# Patient Record
Sex: Female | Born: 1960 | Race: White | Hispanic: No | Marital: Married | Smoking: Never smoker
Health system: Southern US, Community
[De-identification: ages and names within clinical notes are randomized; demographics above are authoritative.]

## PROBLEM LIST (undated history)

## (undated) DIAGNOSIS — Z87442 Personal history of urinary calculi: Secondary | ICD-10-CM

## (undated) DIAGNOSIS — C50919 Malignant neoplasm of unspecified site of unspecified female breast: Secondary | ICD-10-CM

## (undated) DIAGNOSIS — Z9889 Other specified postprocedural states: Secondary | ICD-10-CM

## (undated) DIAGNOSIS — C801 Malignant (primary) neoplasm, unspecified: Secondary | ICD-10-CM

## (undated) DIAGNOSIS — T4145XA Adverse effect of unspecified anesthetic, initial encounter: Secondary | ICD-10-CM

## (undated) DIAGNOSIS — K219 Gastro-esophageal reflux disease without esophagitis: Secondary | ICD-10-CM

## (undated) DIAGNOSIS — C439 Malignant melanoma of skin, unspecified: Secondary | ICD-10-CM

## (undated) DIAGNOSIS — R112 Nausea with vomiting, unspecified: Secondary | ICD-10-CM

## (undated) DIAGNOSIS — Z923 Personal history of irradiation: Secondary | ICD-10-CM

## (undated) DIAGNOSIS — T8859XA Other complications of anesthesia, initial encounter: Secondary | ICD-10-CM

## (undated) HISTORY — PX: FRACTURE SURGERY: SHX138

## (undated) HISTORY — PX: HAND SURGERY: SHX662

## (undated) HISTORY — PX: MELANOMA EXCISION: SHX5266

## (undated) HISTORY — PX: LITHOTRIPSY: SUR834

## (undated) HISTORY — PX: OTHER SURGICAL HISTORY: SHX169

## (undated) HISTORY — DX: Malignant melanoma of skin, unspecified: C43.9

---
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
## 1989-11-08 DIAGNOSIS — Z87442 Personal history of urinary calculi: Secondary | ICD-10-CM

## 1989-11-08 DIAGNOSIS — N200 Calculus of kidney: Secondary | ICD-10-CM | POA: Insufficient documentation

## 1989-11-08 HISTORY — DX: Personal history of urinary calculi: Z87.442

## 1991-11-09 DIAGNOSIS — C439 Malignant melanoma of skin, unspecified: Secondary | ICD-10-CM

## 1991-11-09 HISTORY — DX: Malignant melanoma of skin, unspecified: C43.9

## 2006-07-08 ENCOUNTER — Ambulatory Visit: Payer: Self-pay | Admitting: Obstetrics and Gynecology

## 2007-07-11 DIAGNOSIS — D239 Other benign neoplasm of skin, unspecified: Secondary | ICD-10-CM

## 2007-07-11 HISTORY — DX: Other benign neoplasm of skin, unspecified: D23.9

## 2007-07-13 ENCOUNTER — Ambulatory Visit: Payer: Self-pay | Admitting: Dermatology

## 2008-05-22 DIAGNOSIS — C4491 Basal cell carcinoma of skin, unspecified: Secondary | ICD-10-CM

## 2008-05-22 HISTORY — DX: Basal cell carcinoma of skin, unspecified: C44.91

## 2012-11-08 HISTORY — PX: OTHER SURGICAL HISTORY: SHX169

## 2013-05-04 ENCOUNTER — Ambulatory Visit: Payer: Self-pay | Admitting: Gastroenterology

## 2013-05-07 LAB — PATHOLOGY REPORT

## 2014-03-18 DIAGNOSIS — N390 Urinary tract infection, site not specified: Secondary | ICD-10-CM | POA: Insufficient documentation

## 2016-07-02 ENCOUNTER — Ambulatory Visit: Payer: Self-pay

## 2016-07-02 ENCOUNTER — Ambulatory Visit
Admission: RE | Admit: 2016-07-02 | Discharge: 2016-07-02 | Disposition: A | Discharge status: Home / Self Care | Payer: PRIVATE HEALTH INSURANCE | Source: Ambulatory Visit | Attending: Obstetrics and Gynecology | Admitting: Obstetrics and Gynecology

## 2016-07-02 ENCOUNTER — Other Ambulatory Visit: Payer: Self-pay | Admitting: Obstetrics and Gynecology

## 2016-07-02 DIAGNOSIS — Z1231 Encounter for screening mammogram for malignant neoplasm of breast: Secondary | ICD-10-CM | POA: Diagnosis not present

## 2016-11-08 DIAGNOSIS — Z923 Personal history of irradiation: Secondary | ICD-10-CM

## 2016-11-08 HISTORY — DX: Personal history of irradiation: Z92.3

## 2017-07-12 DIAGNOSIS — C439 Malignant melanoma of skin, unspecified: Secondary | ICD-10-CM | POA: Insufficient documentation

## 2017-08-04 ENCOUNTER — Other Ambulatory Visit: Payer: Self-pay | Admitting: Obstetrics and Gynecology

## 2017-08-04 ENCOUNTER — Ambulatory Visit
Admission: RE | Admit: 2017-08-04 | Discharge: 2017-08-04 | Disposition: A | Discharge status: Home / Self Care | Payer: PRIVATE HEALTH INSURANCE | Source: Ambulatory Visit | Attending: Obstetrics and Gynecology | Admitting: Obstetrics and Gynecology

## 2017-08-04 DIAGNOSIS — R921 Mammographic calcification found on diagnostic imaging of breast: Secondary | ICD-10-CM | POA: Insufficient documentation

## 2017-08-04 DIAGNOSIS — Z1231 Encounter for screening mammogram for malignant neoplasm of breast: Secondary | ICD-10-CM | POA: Diagnosis not present

## 2017-08-04 HISTORY — DX: Malignant (primary) neoplasm, unspecified: C80.1

## 2017-08-08 ENCOUNTER — Other Ambulatory Visit: Payer: Self-pay | Admitting: Obstetrics and Gynecology

## 2017-08-08 DIAGNOSIS — R921 Mammographic calcification found on diagnostic imaging of breast: Secondary | ICD-10-CM

## 2017-08-08 DIAGNOSIS — C50919 Malignant neoplasm of unspecified site of unspecified female breast: Secondary | ICD-10-CM

## 2017-08-08 DIAGNOSIS — R928 Other abnormal and inconclusive findings on diagnostic imaging of breast: Secondary | ICD-10-CM

## 2017-08-08 HISTORY — DX: Malignant neoplasm of unspecified site of unspecified female breast: C50.919

## 2017-08-19 ENCOUNTER — Ambulatory Visit
Admission: RE | Admit: 2017-08-19 | Discharge: 2017-08-19 | Disposition: A | Discharge status: Home / Self Care | Payer: PRIVATE HEALTH INSURANCE | Source: Ambulatory Visit | Attending: Obstetrics and Gynecology | Admitting: Obstetrics and Gynecology

## 2017-08-19 DIAGNOSIS — R928 Other abnormal and inconclusive findings on diagnostic imaging of breast: Secondary | ICD-10-CM

## 2017-08-19 DIAGNOSIS — R921 Mammographic calcification found on diagnostic imaging of breast: Secondary | ICD-10-CM | POA: Insufficient documentation

## 2017-08-23 ENCOUNTER — Other Ambulatory Visit: Payer: Self-pay | Admitting: Obstetrics and Gynecology

## 2017-08-23 DIAGNOSIS — R921 Mammographic calcification found on diagnostic imaging of breast: Secondary | ICD-10-CM

## 2017-08-23 DIAGNOSIS — R928 Other abnormal and inconclusive findings on diagnostic imaging of breast: Secondary | ICD-10-CM

## 2017-09-01 ENCOUNTER — Ambulatory Visit
Admission: RE | Admit: 2017-09-01 | Discharge: 2017-09-01 | Disposition: A | Discharge status: Home / Self Care | Payer: PRIVATE HEALTH INSURANCE | Source: Ambulatory Visit | Attending: Obstetrics and Gynecology | Admitting: Obstetrics and Gynecology

## 2017-09-01 DIAGNOSIS — R921 Mammographic calcification found on diagnostic imaging of breast: Secondary | ICD-10-CM

## 2017-09-01 DIAGNOSIS — R928 Other abnormal and inconclusive findings on diagnostic imaging of breast: Secondary | ICD-10-CM

## 2017-09-01 HISTORY — PX: BREAST BIOPSY: SHX20

## 2017-09-06 ENCOUNTER — Other Ambulatory Visit: Payer: Self-pay | Admitting: Pathology

## 2017-09-06 LAB — SURGICAL PATHOLOGY

## 2017-09-08 NOTE — Progress Notes (Signed)
  Oncology Nurse Navigator Documentation  Navigator Location: [facility]Med Onc (09/08/17 1700) Referral date to RadOnc/MedOnc: 09/08/17 (09/08/17 1700) )Navigator Encounter Type: Introductory phone call (09/08/17 1700)   Abnormal Finding Date: 08/19/17 (09/08/17 1700) Confirmed Diagnosis Date: 09/01/17 (09/08/17 1700)               Patient Visit Type: Initial (09/08/17 1700)   Barriers/Navigation Needs: Coordination of Care;Education (09/08/17 1700) Education: Accessing Care/ Finding Providers;Understanding Cancer/ Treatment Options;Coping with Diagnosis/ Prognosis;Newly Diagnosed Cancer Education (09/08/17 1700) Interventions: Coordination of Care;Referrals (09/08/17 1700)   Coordination of Care: Appts (09/08/17 1700)                  Time Spent with Patient: 45 (09/08/17 1700)   Introduced IT trainer.  Schedule Surgical Consult with Dr. Tamala Julian 11/2//18 at 0945.

## 2017-09-12 ENCOUNTER — Other Ambulatory Visit: Payer: Self-pay | Admitting: Surgery

## 2017-09-12 ENCOUNTER — Telehealth: Payer: Self-pay | Admitting: Oncology

## 2017-09-12 ENCOUNTER — Telehealth: Payer: Self-pay | Admitting: *Deleted

## 2017-09-12 DIAGNOSIS — D0511 Intraductal carcinoma in situ of right breast: Secondary | ICD-10-CM

## 2017-09-12 NOTE — Telephone Encounter (Signed)
Consult for Right Breast DCIS. Ref by Dr. Benjaman Kindler. Notes in Epic. Appt req by [facility] msg. New patient packet left at Registration for patient to complete upon arrival. L/M on patient v/m. Anne/Christy notified via staff msg.

## 2017-09-12 NOTE — Telephone Encounter (Signed)
Received a call from [facility] at Dr. Thompson Caul office that the patient was not aware of a medical oncology appointment.  Called and left patient a message that she has an appointment with Dr. Tasia Catchings tomorrow at 9:30.

## 2017-09-13 ENCOUNTER — Inpatient Hospital Stay: Payer: PRIVATE HEALTH INSURANCE | Attending: Oncology | Admitting: Oncology

## 2017-09-13 ENCOUNTER — Encounter: Payer: Self-pay | Admitting: Oncology

## 2017-09-13 VITALS — BP 119/75 | HR 79 | Temp 98.0°F | Resp 18 | Wt 173.4 lb

## 2017-09-13 DIAGNOSIS — Z79899 Other long term (current) drug therapy: Secondary | ICD-10-CM | POA: Diagnosis not present

## 2017-09-13 DIAGNOSIS — Z170 Estrogen receptor positive status [ER+]: Secondary | ICD-10-CM | POA: Insufficient documentation

## 2017-09-13 DIAGNOSIS — G8929 Other chronic pain: Secondary | ICD-10-CM | POA: Diagnosis not present

## 2017-09-13 DIAGNOSIS — Z85820 Personal history of malignant melanoma of skin: Secondary | ICD-10-CM | POA: Insufficient documentation

## 2017-09-13 DIAGNOSIS — D0511 Intraductal carcinoma in situ of right breast: Secondary | ICD-10-CM | POA: Diagnosis not present

## 2017-09-13 DIAGNOSIS — M549 Dorsalgia, unspecified: Secondary | ICD-10-CM | POA: Insufficient documentation

## 2017-09-13 NOTE — Progress Notes (Signed)
New patient in to see Dr Tasia Catchings today, for right breast cancer.

## 2017-09-13 NOTE — Progress Notes (Signed)
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 Patient had recent bilateral mammogram done on 08/04/2017 which demonstrated calcifications in the right breast. She had additional film on 08/19/2017 which demonstrated calcification in the posterior aspect of the upper outer quadrant of the right breast. Calcifications spanning an area of 8 mm and are associated with an asymmetric density. Patient had a steroid tactic biopsy on 09/01/2017. Pathology reveals high-grade DCIS, with comedonecrosis and calcifications. ER positive > 90%, PR positive>1%.  Patient denies palpating any breast mass or having any nipple discharge. She denies any breast tenderness or skin changes. She has a remote history of 2 episodes of early stage melanoma. First episode was back in 1993 and she received resection. Since then she has dermatology surveillance routinely and found out another early-stage melanoma which was resected again. She does any family history of cancer. She has been on OCP for many years until 3 years ago, she was taken off OCP and her menstrual period stopped 3 years ago. She denies any use of estrogen replacement. 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 Reports chronic back pain. Denies any cough, chest pain, abdominal pain.   Review of systems- Review of Systems  Constitutional: Negative for chills and fever.  HENT: Negative for ear pain, hearing loss and tinnitus.   Eyes: Negative for double vision and pain.  Respiratory: Negative for  cough and hemoptysis.   Cardiovascular: Negative for chest pain and palpitations.  Gastrointestinal: Negative for heartburn and nausea.  Genitourinary: Negative for dysuria and urgency.  Musculoskeletal: Positive for back pain.  Skin: Negative for rash.  Neurological: Negative for dizziness.  Endo/Heme/Allergies: Does not bruise/bleed easily.  Psychiatric/Behavioral: Negative for depression.   Allergies  Allergen Reactions  . Sulfa Antibiotics Hives and Rash     Past Medical History:  Diagnosis Date  . Cancer ([place])    melanoma    Past Surgical History:  Procedure Laterality Date  . BREAST BIOPSY Right 09/01/2017   AFFIRM Stereo/path pending  . coloscopy  2014  . leg left rod and pins      Social History   Socioeconomic History  . Marital status: Married    Spouse name: Not on file  . Number of children: Not on file  . Years of education: Not on file  . Highest education level: Not on file  Social Needs  . Financial resource strain: Not on file  . Food insecurity - worry: Not on file  . Food insecurity - inability: Not on file  . Transportation needs - medical: Not on file  . Transportation needs - non-medical: Not on file  Occupational History  . Not on file  Tobacco Use  . Smoking status: Never Smoker  . Smokeless tobacco: Never Used  . Tobacco comment: no tobacco use  Substance and Sexual Activity  . Alcohol use: No    Frequency: Never  . Drug use: No  . Sexual activity: Yes  Other Topics Concern  . Not on file  Social History Narrative  . Not on file     Family  History  Problem Relation Age of Onset  . Hypertension Mother   . Heart disease Mother   . Diabetes Father   . Hypertension Father   . Heart disease Father   . Breast cancer Neg Hx      Current Outpatient Medications:  .  cholecalciferol (VITAMIN D) 1000 units tablet, Take 5,000 Units 2 (two) times daily by mouth., Disp: , Rfl:  .  omega-3 acid ethyl esters (LOVAZA) 1 g capsule, Take  1 g 2 (two) times daily by mouth., Disp: , Rfl:    Physical exam:  Vitals:   09/13/17 1022  BP: 119/75  Pulse: 79  Resp: 18  Temp: 98 F (36.7 C)  TempSrc: Tympanic  Weight: 173 lb 7 oz (78.7 kg)   GENERAL:Alert, no distress and comfortable.  EYES: no pallor or icterus OROPHARYNX: no thrush or ulceration; good dentition  NECK: supple, no masses felt LYMPH:  no palpable lymphadenopathy in the cervical, axillary or inguinal regions LUNGS: clear to auscultation and  No wheeze or crackles HEART/CVS: regular rate & rhythm and no murmurs; No lower extremity edema ABDOMEN: abdomen soft, non-tender and normal bowel sounds Musculoskeletal:no cyanosis of digits and no clubbing  PSYCH: alert & oriented x 3  NEURO: no focal motor/sensory deficits SKIN:  no rashes or significant lesions Breasts: breasts appear normal, no suspicious masses, no skin or nipple changes or axillary nodes  Mm Digital Diagnostic Unilat R  Result Date: 08/19/2017 CLINICAL DATA:  Patient was called back from screening mammogram for calcifications in the right breast. EXAM: DIGITAL DIAGNOSTIC RIGHT MAMMOGRAM WITH CAD ULTRASOUND RIGHT BREAST COMPARISON:  With priors. ACR Breast Density Category b: There are scattered areas of fibroglandular density. FINDINGS: Additional imaging of the right breast was performed. There are developing calcifications in the far posterior aspect of the upper-outer quadrant of the right breast. Calcifications located more inferior and posteriorly span an area of 8 mm and are associated with an asymmetric density. They are heterogeneous in appearance and indeterminate. There are 2 calcifications located more anteriorly. They are smooth, round and benign in appearance. Mammographic images were processed with CAD. On physical exam, I do not palpate a mass in the upper-outer quadrant of the right breast. Targeted ultrasound is performed, showing normal tissue in the upper-outer quadrant of the right  breast. No solid or cystic mass, abnormal shadowing or distortion visualized. Sonographic evaluation the right axilla does not show any enlarged adenopathy. IMPRESSION: Suspicious right breast calcifications. RECOMMENDATION: Stereotactic biopsy of the right breast calcifications is recommended. The calcifications are located far posteriorly and may not be amenable to stereotactic biopsy. If we are unable to stereotactically biopsy the calcifications I recommend surgical excision. I have discussed the findings and recommendations with the patient. Results were also provided in writing at the conclusion of the visit. If applicable, a reminder letter will be sent to the patient regarding the next appointment. BI-RADS CATEGORY  4: Suspicious. Electronically Signed   By: Lillia Mountain M.D.   On: 08/19/2017 13:48   US Breast Ltd Uni Right Inc Axilla  Result Date: 08/19/2017 CLINICAL DATA:  Patient was called back from screening mammogram for calcifications in the right breast. EXAM: DIGITAL DIAGNOSTIC RIGHT MAMMOGRAM WITH CAD ULTRASOUND RIGHT BREAST COMPARISON:  With priors. ACR Breast Density Category b: There are scattered areas of fibroglandular density. FINDINGS: Additional imaging of the right breast was performed. There are developing calcifications in the far posterior aspect of the upper-outer quadrant of the right breast. Calcifications located  more inferior and posteriorly span an area of 8 mm and are associated with an asymmetric density. They are heterogeneous in appearance and indeterminate. There are 2 calcifications located more anteriorly. They are smooth, round and benign in appearance. Mammographic images were processed with CAD. On physical exam, I do not palpate a mass in the upper-outer quadrant of the right breast. Targeted ultrasound is performed, showing normal tissue in the upper-outer quadrant of the right breast. No solid or cystic mass, abnormal shadowing or distortion visualized.  Sonographic evaluation the right axilla does not show any enlarged adenopathy. IMPRESSION: Suspicious right breast calcifications. RECOMMENDATION: Stereotactic biopsy of the right breast calcifications is recommended. The calcifications are located far posteriorly and may not be amenable to stereotactic biopsy. If we are unable to stereotactically biopsy the calcifications I recommend surgical excision. I have discussed the findings and recommendations with the patient. Results were also provided in writing at the conclusion of the visit. If applicable, a reminder letter will be sent to the patient regarding the next appointment. BI-RADS CATEGORY  4: Suspicious. Electronically Signed   By: Lillia Mountain M.D.   On: 08/19/2017 13:48   Mm Clip Placement Right  Result Date: 09/01/2017 CLINICAL DATA:  Status post stereotactic biopsy for right breast calcifications. EXAM: DIAGNOSTIC RIGHT MAMMOGRAM POST STEREOTACTIC BIOPSY COMPARISON:  Previous exam(s). FINDINGS: Mammographic images were obtained following stereotactic guided biopsy of right breast calcifications. At the conclusion of the procedure, a ribbon shaped clip was placed at the biopsy site. Biopsy clip is displaced approximately 2 cm lateral to the biopsy cavity. IMPRESSION: Ribbon shaped biopsy clip is displaced approximately 2 cm lateral to the biopsy cavity. A few residual calcifications are seen adjacent to the biopsy cavity which can be used for localization purposes if pathology result necessitates surgical excision. Final Assessment: Post Procedure Mammograms for Marker Placement Electronically Signed   By: Franki Cabot M.D.   On: 09/01/2017 13:58   Mm Rt Breast Bx W Loc Dev 1st Lesion Image Bx Spec Stereo Guide  Addendum Date: 09/09/2017   ADDENDUM REPORT: 09/08/2017 17:12 ADDENDUM: At the patient's request, a surgical referral was made with Dr. Tamala Julian for 09/09/17 at 9:45 AM by Al Pimple, RN, nurse navigator for [facility].  The patient has been notified of the appointment. Addendum by Jetta Lout, RRA on 09/08/17. Electronically Signed   By: Claudie Revering M.D.   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 Microinvasive carcinoma could not be excluded. This is concordant with the imaging findings. This will be communicated to the nurse navigators at [facility] and they will arrange a consultation appointment for the patient. The final pathological diagnosis and recommendation were discussed with the patient by telephone on 09/06/2017. Her questions were answered. She reported mild bruising at the biopsy site with no pain or palpable hematoma. Electronically Signed   By: Claudie Revering M.D.   On: 09/07/2017 09:21   Result Date: 09/09/2017 CLINICAL DATA:  Patient with right breast calcifications presents today for stereotactic biopsy using 3D tomosynthesis guidance. EXAM: RIGHT BREAST STEREOTACTIC CORE NEEDLE BIOPSY COMPARISON:  Previous exams. FINDINGS: The patient and I discussed the procedure of stereotactic-guided biopsy including benefits and alternatives. We discussed the high likelihood of a successful procedure. We discussed the risks of the procedure including infection, bleeding, tissue injury, clip migration, and inadequate sampling. Informed written  consent was given. The usual time out protocol was performed immediately prior to the procedure. Using sterile technique and 1% Lidocaine as local anesthetic, under stereotactic guidance, a 9 gauge vacuum assisted device was used to perform core needle biopsy of calcifications in the upper-outer quadrant of the right breast using a lateral approach. Specimen radiograph was performed showing calcifications. Specimens with calcifications are identified for pathology. Lesion quadrant: Upper outer quadrant  At the conclusion of the procedure, a ribbon shaped tissue marker clip was deployed into the biopsy cavity. Follow-up 2-view mammogram was performed and dictated separately. IMPRESSION: Stereotactic-guided biopsy of calcifications in the upper-outer quadrant of the right breast. No apparent complications. Electronically Signed: By: Franki Cabot M.D. On: 09/01/2017 13:38    Assessment and plan- Patient is a 56 y.o. female who has distant history of melanoma s/p resection presented for evaluation of recently diagnosed right breast high grade DCIS.  Pathology results was discussed with patient. Patient has already been evaluated by Dr. Tamala Julian. Agree with Dr. Tamala Julian that patient needs lumpectomy plus minus sentinel lymph node biopsy due to high grade DCIS. Final recommendation for adjuvant therapy pending on resection pathology.  Thank you for this kind referral and the opportunity to participate in the care of this patient Follow up approximately 1 week after lumpectomy.   Dr. Earlie Server, MD, PhD [facility] Pager- [phone] 09/13/2017

## 2017-09-14 NOTE — Progress Notes (Signed)
  Oncology Nurse Navigator Documentation  Navigator Location: [facility] Onc (09/13/17 1359)   )Navigator Encounter Type: Initial MedOnc (09/13/17 1359)       Surgery Date: 09/27/17 (09/13/17 1359)             Patient Visit Type: MedOnc;Initial (09/13/17 1359) Treatment Phase: Pre-Tx/Tx Discussion (09/13/17 1359) Barriers/Navigation Needs: Education;Coordination of Care (09/13/17 1359) Education: Understanding Cancer/ Treatment Options;Coping with Diagnosis/ Prognosis;Preparing for Upcoming Surgery/ Treatment;Newly Diagnosed Cancer Education (09/13/17 1359) Interventions: Coordination of Care;Education (09/13/17 1359)   Coordination of Care: Appts (09/13/17 1359)                  Time Spent with Patient: 60 (09/13/17 1359)   Met with patient at initial Med/Onc consult.  Dr. Tasia Catchings thoroughly explained treatment plans with final plan dependent on Surgical Pathology.   Patient is scheduled for lumpectomy with Dr. Rochel Brome on 09/27/17.

## 2017-09-15 ENCOUNTER — Other Ambulatory Visit: Payer: Self-pay

## 2017-09-19 ENCOUNTER — Inpatient Hospital Stay: Payer: PRIVATE HEALTH INSURANCE

## 2017-09-21 ENCOUNTER — Other Ambulatory Visit: Payer: Self-pay

## 2017-09-21 ENCOUNTER — Encounter
Admission: RE | Admit: 2017-09-21 | Discharge: 2017-09-21 | Disposition: A | Discharge status: Home / Self Care | Payer: PRIVATE HEALTH INSURANCE | Source: Ambulatory Visit | Attending: Surgery | Admitting: Surgery

## 2017-09-21 HISTORY — DX: Personal history of urinary calculi: Z87.442

## 2017-09-21 HISTORY — DX: Other complications of anesthesia, initial encounter: T88.59XA

## 2017-09-21 HISTORY — DX: Nausea with vomiting, unspecified: R11.2

## 2017-09-21 HISTORY — DX: Adverse effect of unspecified anesthetic, initial encounter: T41.45XA

## 2017-09-21 HISTORY — DX: Other specified postprocedural states: Z98.890

## 2017-09-21 HISTORY — DX: Gastro-esophageal reflux disease without esophagitis: K21.9

## 2017-09-21 NOTE — Patient Instructions (Signed)
  Your procedure is scheduled on: 09-27-17 Report to [place] [facility] @ 7:45 AM  Remember: Instructions that are not followed completely may result in serious medical risk, up to and including death, or upon the discretion of your surgeon and anesthesiologist your surgery may need to be rescheduled.    _x___ 1. Do not eat food after midnight the night before your procedure. You may drink clear liquids up to 2 hours before you are scheduled to arrive at the hospital for your procedure.  Do not drink clear liquids within 2 hours of your scheduled arrival to the hospital.  Clear liquids include  --Water or Apple juice without pulp  --Clear carbohydrate beverage such as ClearFast or Gatorade  --Black Coffee or Clear Tea  (No milk, no creamers, do not add anything to  the coffee or Tea   No gum chewing or hard candies.     __x__ 2. No Alcohol for 24 hours before or after surgery.   __x__3. No Smoking for 24 prior to surgery.   ____  4. Bring all medications with you on the day of surgery if instructed.    __x__ 5. Notify your doctor if there is any change in your medical condition     (cold, fever, infections).     Do not wear jewelry, make-up, hairpins, clips or nail polish.  Do not wear lotions, powders, or perfumes. You may wear deodorant.  Do not shave 48 hours prior to surgery. Men may shave face and neck.  Do not bring valuables to the hospital.    [facility] is not responsible for any belongings or valuables.               Contacts, dentures or bridgework may not be worn into surgery.  Leave your suitcase in the car. After surgery it may be brought to your room.  For patients admitted to the hospital, discharge time is determined by your treatment team.   Patients discharged the day of surgery will not be allowed to drive home.  You will need someone to drive you home and stay with you the night of your procedure.      ____ Take anti-hypertensive listed below, cardiac,  seizure, asthma, anti-reflux and psychiatric medicines. These include:  1. NONE  2.  3.  4.  5.  6.  ____Fleets enema or Magnesium Citrate as directed.   ____ Use CHG Soap or sage wipes as directed on instruction sheet   ____ Use inhalers on the day of surgery and bring to hospital day of surgery  ____ Stop Metformin and Janumet 2 days prior to surgery.    ____ Take 1/2 of usual insulin dose the night before surgery and none on the morning  surgery.   ____ Follow recommendations from Cardiologist, Pulmonologist or PCP regarding stopping Aspirin, Coumadin, Plavix ,Eliquis, Effient, or Pradaxa, and Pletal.  X____Stop Anti-inflammatories such as Advil, Aleve, Ibuprofen, Motrin, Naproxen, Naprosyn, Goodies powders or aspirin products NOW-OK to take Tylenol    _X___ Stop supplements until after surgery-PT STOPPED FISH OIL ALREADY   ____ Bring C-Pap to the hospital.

## 2017-09-26 ENCOUNTER — Other Ambulatory Visit: Payer: Self-pay | Admitting: Surgery

## 2017-09-26 ENCOUNTER — Encounter: Payer: Self-pay | Admitting: *Deleted

## 2017-09-26 DIAGNOSIS — D0511 Intraductal carcinoma in situ of right breast: Secondary | ICD-10-CM

## 2017-09-27 ENCOUNTER — Encounter
Admission: RE | Disposition: A | Discharge status: Home / Self Care | Payer: Self-pay | Source: Ambulatory Visit | Attending: Surgery

## 2017-09-27 ENCOUNTER — Encounter: Payer: Self-pay | Admitting: Emergency Medicine

## 2017-09-27 ENCOUNTER — Ambulatory Visit
Admission: RE | Admit: 2017-09-27 | Discharge: 2017-09-27 | Disposition: A | Discharge status: Home / Self Care | Payer: PRIVATE HEALTH INSURANCE | Source: Ambulatory Visit | Attending: Surgery | Admitting: Surgery

## 2017-09-27 ENCOUNTER — Ambulatory Visit: Payer: PRIVATE HEALTH INSURANCE | Admitting: Certified Registered Nurse Anesthetist

## 2017-09-27 DIAGNOSIS — Z882 Allergy status to sulfonamides status: Secondary | ICD-10-CM | POA: Insufficient documentation

## 2017-09-27 DIAGNOSIS — K219 Gastro-esophageal reflux disease without esophagitis: Secondary | ICD-10-CM | POA: Diagnosis not present

## 2017-09-27 DIAGNOSIS — D0511 Intraductal carcinoma in situ of right breast: Secondary | ICD-10-CM

## 2017-09-27 DIAGNOSIS — Z79899 Other long term (current) drug therapy: Secondary | ICD-10-CM | POA: Insufficient documentation

## 2017-09-27 HISTORY — DX: Malignant neoplasm of unspecified site of unspecified female breast: C50.919

## 2017-09-27 HISTORY — PX: BREAST LUMPECTOMY: SHX2

## 2017-09-27 HISTORY — PX: PARTIAL MASTECTOMY WITH NEEDLE LOCALIZATION: SHX6008

## 2017-09-27 HISTORY — PX: SENTINEL NODE BIOPSY: SHX6608

## 2017-09-27 SURGERY — PARTIAL MASTECTOMY WITH NEEDLE LOCALIZATION
Anesthesia: General | Laterality: Right | Wound class: Clean

## 2017-09-27 MED ORDER — FAMOTIDINE 20 MG PO TABS
ORAL_TABLET | ORAL | Status: AC
  Filled 2017-09-27: qty 1

## 2017-09-27 MED ORDER — BUPIVACAINE-EPINEPHRINE 0.5% -1:200000 IJ SOLN
INTRAMUSCULAR | Status: DC | PRN
  Administered 2017-09-27: 15 mL

## 2017-09-27 MED ORDER — BUPIVACAINE-EPINEPHRINE (PF) 0.5% -1:200000 IJ SOLN
INTRAMUSCULAR | Status: AC
  Filled 2017-09-27: qty 30

## 2017-09-27 MED ORDER — HYDROCODONE-ACETAMINOPHEN 5-325 MG PO TABS
1.0000 | ORAL_TABLET | Freq: Four times a day (QID) | ORAL | Status: DC | PRN
  Administered 2017-09-27: 1 via ORAL

## 2017-09-27 MED ORDER — ONDANSETRON HCL 4 MG/2ML IJ SOLN: 4.0000 mg | Freq: Once | INTRAMUSCULAR | Status: DC | PRN

## 2017-09-27 MED ORDER — LACTATED RINGERS IV SOLN
INTRAVENOUS | Status: DC
  Administered 2017-09-27: 10:00:00 via INTRAVENOUS

## 2017-09-27 MED ORDER — ONDANSETRON HCL 4 MG/2ML IJ SOLN
INTRAMUSCULAR | Status: DC | PRN
  Administered 2017-09-27: 4 mg via INTRAVENOUS

## 2017-09-27 MED ORDER — MIDAZOLAM HCL 2 MG/2ML IJ SOLN
INTRAMUSCULAR | Status: AC
  Filled 2017-09-27: qty 2

## 2017-09-27 MED ORDER — FENTANYL CITRATE (PF) 250 MCG/5ML IJ SOLN
INTRAMUSCULAR | Status: AC
  Filled 2017-09-27: qty 5

## 2017-09-27 MED ORDER — FENTANYL CITRATE (PF) 100 MCG/2ML IJ SOLN
INTRAMUSCULAR | Status: AC
  Filled 2017-09-27: qty 2

## 2017-09-27 MED ORDER — HYDROCODONE-ACETAMINOPHEN 5-325 MG PO TABS
ORAL_TABLET | ORAL | Status: AC
  Administered 2017-09-27: 1 via ORAL
  Filled 2017-09-27: qty 1

## 2017-09-27 MED ORDER — PROPOFOL 10 MG/ML IV BOLUS
INTRAVENOUS | Status: DC | PRN
  Administered 2017-09-27: 160 mg via INTRAVENOUS

## 2017-09-27 MED ORDER — LIDOCAINE HCL (CARDIAC) 20 MG/ML IV SOLN
INTRAVENOUS | Status: DC | PRN
  Administered 2017-09-27: 60 mg via INTRAVENOUS

## 2017-09-27 MED ORDER — SCOPOLAMINE 1 MG/3DAYS TD PT72
MEDICATED_PATCH | TRANSDERMAL | Status: AC
  Filled 2017-09-27: qty 1

## 2017-09-27 MED ORDER — MIDAZOLAM HCL 2 MG/2ML IJ SOLN
INTRAMUSCULAR | Status: DC | PRN
  Administered 2017-09-27: 2 mg via INTRAVENOUS

## 2017-09-27 MED ORDER — SCOPOLAMINE 1 MG/3DAYS TD PT72
1.0000 | MEDICATED_PATCH | TRANSDERMAL | Status: DC
  Administered 2017-09-27: 1.5 mg via TRANSDERMAL

## 2017-09-27 MED ORDER — PROPOFOL 10 MG/ML IV BOLUS
INTRAVENOUS | Status: AC
  Filled 2017-09-27: qty 20

## 2017-09-27 MED ORDER — FENTANYL CITRATE (PF) 100 MCG/2ML IJ SOLN: 25.0000 ug | INTRAMUSCULAR | Status: DC | PRN

## 2017-09-27 MED ORDER — TECHNETIUM TC 99M SULFUR COLLOID FILTERED
0.8000 | Freq: Once | INTRAVENOUS | Status: AC | PRN
  Administered 2017-09-27: 0.8 via INTRADERMAL

## 2017-09-27 MED ORDER — DEXAMETHASONE SODIUM PHOSPHATE 10 MG/ML IJ SOLN
INTRAMUSCULAR | Status: DC | PRN
  Administered 2017-09-27: 8 mg via INTRAVENOUS

## 2017-09-27 MED ORDER — HYDROCODONE-ACETAMINOPHEN 5-325 MG PO TABS: 1.0000 | ORAL_TABLET | Freq: Four times a day (QID) | ORAL | 0 refills | Status: DC | PRN

## 2017-09-27 MED ORDER — FENTANYL CITRATE (PF) 100 MCG/2ML IJ SOLN
INTRAMUSCULAR | Status: DC | PRN
  Administered 2017-09-27: 25 ug via INTRAVENOUS
  Administered 2017-09-27: 50 ug via INTRAVENOUS
  Administered 2017-09-27 (×5): 25 ug via INTRAVENOUS

## 2017-09-27 MED ORDER — FAMOTIDINE 20 MG PO TABS
20.0000 mg | ORAL_TABLET | Freq: Once | ORAL | Status: AC
  Administered 2017-09-27: 20 mg via ORAL

## 2017-09-27 MED ORDER — LIDOCAINE HCL (PF) 2 % IJ SOLN
INTRAMUSCULAR | Status: AC
  Filled 2017-09-27: qty 10

## 2017-09-27 SURGICAL SUPPLY — 33 items
BLADE SURG 15 STRL LF DISP TIS (BLADE) ×1 IMPLANT
BLADE SURG 15 STRL SS (BLADE) ×1
CANISTER SUCT 1200ML W/VALVE (MISCELLANEOUS) ×2 IMPLANT
CHLORAPREP W/TINT 26ML (MISCELLANEOUS) ×2 IMPLANT
CNTNR SPEC 2.5X3XGRAD LEK (MISCELLANEOUS) ×2
CONT SPEC 4OZ STER OR WHT (MISCELLANEOUS) ×2
CONTAINER SPEC 2.5X3XGRAD LEK (MISCELLANEOUS) ×2 IMPLANT
DERMABOND ADVANCED (GAUZE/BANDAGES/DRESSINGS) ×2
DERMABOND ADVANCED .7 DNX12 (GAUZE/BANDAGES/DRESSINGS) ×2 IMPLANT
DEVICE DUBIN SPECIMEN MAMMOGRA (MISCELLANEOUS) ×2 IMPLANT
DRAPE LAPAROTOMY 77X122 PED (DRAPES) ×2 IMPLANT
ELECT REM PT RETURN 9FT ADLT (ELECTROSURGICAL) ×2
ELECTRODE REM PT RTRN 9FT ADLT (ELECTROSURGICAL) ×1 IMPLANT
GLOVE BIO SURGEON STRL SZ7.5 (GLOVE) ×2 IMPLANT
GOWN STRL REUS W/ TWL LRG LVL3 (GOWN DISPOSABLE) ×2 IMPLANT
GOWN STRL REUS W/TWL LRG LVL3 (GOWN DISPOSABLE) ×2
KIT RM TURNOVER STRD PROC AR (KITS) ×2 IMPLANT
LABEL OR SOLS (LABEL) ×2 IMPLANT
MARGIN MAP 10MM (MISCELLANEOUS) ×2 IMPLANT
NDL SAFETY 18GX1.5 (NEEDLE) ×2 IMPLANT
NDL SAFETY 22GX1.5 (NEEDLE) ×2 IMPLANT
NEEDLE HYPO 25X1 1.5 SAFETY (NEEDLE) ×2 IMPLANT
PACK BASIN MINOR ARMC (MISCELLANEOUS) ×2 IMPLANT
SLEVE PROBE SENORX GAMMA FIND (MISCELLANEOUS) ×2 IMPLANT
SUT CHROMIC 3 0 SH 27 (SUTURE) IMPLANT
SUT CHROMIC 4 0 RB 1X27 (SUTURE) ×2 IMPLANT
SUT ETHILON 3-0 FS-10 30 BLK (SUTURE) ×2
SUT MNCRL 4-0 (SUTURE) ×1
SUT MNCRL 4-0 27XMFL (SUTURE) ×1
SUTURE EHLN 3-0 FS-10 30 BLK (SUTURE) ×1 IMPLANT
SUTURE MNCRL 4-0 27XMF (SUTURE) ×1 IMPLANT
SYRINGE 10CC LL (SYRINGE) ×2 IMPLANT
WATER STERILE IRR 1000ML POUR (IV SOLUTION) ×2 IMPLANT

## 2017-09-27 NOTE — Transfer of Care (Signed)
Immediate Anesthesia Transfer of Care Note  Patient: Lacey Garcia  Procedure(s) Performed: PARTIAL MASTECTOMY WITH NEEDLE LOCALIZATION (Right ) SENTINEL NODE BIOPSY (Right )  Patient Location: PACU  Anesthesia Type:General  Level of Consciousness: awake, drowsy and patient cooperative  Airway & Oxygen Therapy: Patient Spontanous Breathing and Patient connected to face mask oxygen  Post-op Assessment: Report given to RN and Post -op Vital signs reviewed and stable  Post vital signs: Reviewed and stable  Last Vitals:  Vitals:   09/27/17 0939  BP: 112/61  Pulse: 72  Resp: 17  Temp: (!) 36 C  SpO2: 99%    Last Pain:  Vitals:   09/27/17 0939  TempSrc: Tympanic         Complications: No apparent anesthesia complications

## 2017-09-27 NOTE — H&P (Signed)
  She comes today prepared for a right partial mastectomy with sentinel lymph node biopsy.  She reports no change in overall condition since the day of the office exam.  She has had injection of radioactive technetium sulfur colloid.  She has had a Kopan's wire inserted.  I have reviewed these images.  The calcifications are anterior to the tip.  The right side was marked YES  Lab work reviewed  I discussed the plan for surgery

## 2017-09-27 NOTE — Anesthesia Postprocedure Evaluation (Signed)
Anesthesia Post Note  Patient: Lacey Garcia  Procedure(s) Performed: PARTIAL MASTECTOMY WITH NEEDLE LOCALIZATION (Right ) SENTINEL NODE BIOPSY (Right )  Patient location during evaluation: PACU Anesthesia Type: General Level of consciousness: awake and alert Pain management: pain level controlled Vital Signs Assessment: post-procedure vital signs reviewed and stable Respiratory status: spontaneous breathing, nonlabored ventilation, respiratory function stable and patient connected to nasal cannula oxygen Cardiovascular status: blood pressure returned to baseline and stable Postop Assessment: no apparent nausea or vomiting Anesthetic complications: no     Last Vitals:  Vitals:   09/27/17 0939 09/27/17 1318  BP: 112/61 (!) 143/69  Pulse: 72 (!) 108  Resp: 17 16  Temp: (!) 36 C 36.9 C  SpO2: 99% 98%    Last Pain:  Vitals:   09/27/17 1318  TempSrc:   PainSc: 0-No pain                 Molli Barrows

## 2017-09-27 NOTE — Anesthesia Procedure Notes (Signed)
Procedure Name: LMA Insertion Date/Time: 09/27/2017 10:59 AM Performed by: Eben Burow, CRNA Pre-anesthesia Checklist: Patient identified, Emergency Drugs available, Suction available, Patient being monitored and Timeout performed Patient Re-evaluated:Patient Re-evaluated prior to induction Oxygen Delivery Method: Circle system utilized Preoxygenation: Pre-oxygenation with 100% oxygen Induction Type: IV induction Ventilation: Mask ventilation without difficulty LMA: LMA inserted LMA Size: 4.0 Number of attempts: 1 Placement Confirmation: positive ETCO2 and breath sounds checked- equal and bilateral Tube secured with: Tape Dental Injury: Teeth and Oropharynx as per pre-operative assessment

## 2017-09-27 NOTE — Addendum Note (Signed)
Addendum  created 09/27/17 1446 by Eben Burow, CRNA   Intraprocedure Meds edited

## 2017-09-27 NOTE — Op Note (Signed)
OPERATIVE REPORT  PREOPERATIVE  DIAGNOSIS: .  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  Right partial mastectomy with sentinel lymph node biopsy.   ANESTHESIA:  General  SURGEON: Rochel Brome  MD   INDICATIONS: . She had recent mammograms demonstrating a cluster of microcalcifications in the upper outer quadrant of the right breast.  She had stereotactic needle biopsy with findings of high-grade ductal carcinoma in situ with comedonecrosis and calcifications.  Surgery was recommended for definitive treatment.  She had preoperative injection of radioactive technetium sulfur colloid.  She also had a Kopan's wire inserted.  It is further noted that the biopsy marker was 2 cm lateral to the calcifications.  The Kopans wire entered laterally so that the barb of the wire was posterior to the calcifications.  The radiologist reported that the calcifications extended from the proximal portion of the thick portion of the wire to the calcifications anterior to the barb.  Mammogram images were reviewed prior to incision and also during the procedure.  With the patient on the operating table in the supine position under general anesthesia the right arm was placed on a lateral arm support.  The dressing was removed from the right breast exposing the Kopan's wire which entered the lateral peripheral aspect of the breast.  The breast and axilla and chest wall were prepared with ChloraPrep and draped in a sterile manner.  A curvilinear incision was made from the 8:30 position to the 10 o'clock position 10.5 cm from the nipple.  The dissection was carried down through subcutaneous tissues with electrocautery used for hemostasis.  Dissection was carried down to encounter the wire.  This portion of wire was proximal to the thick portion of the wire. A portion of tissue surrounding the wire was  dissected free from surrounding tissues.  This dissection was extended medial to the barb and also well anterior to the barb.  As the specimen was being excised margin maps were sutured to the specimen to mark the cranio caudal medial lateral superficial and deep margins.  The specimen was submitted for specimen mammogram and pathology to check for margins.  The pathologist called to report that the biopsy marker was identified also it appeared that the cut margins were free of tumor.  The radiologist reported the specimen mammogram demonstrates the clip and the remaining calcifications.  There was also a calcification seen near the lateral margin.  Therefore a re-excision of the lateral margin was done grasping the lateral margin with Allis clamps and dissecting out a portion of tissue approximately 2 x 4 cm in dimension and also ranging from approximately 5-10 mm in thickness.  This was placed so that the new margin was touching the Telfa and sutured to the Telfa at 4 edges.  Hemostasis was intact.  The axilla was probed with a gamma counter demonstrating the location of radioactivity.  An oblique incision was made in the inferior aspect of the axilla and carried down through subcutaneous tissues.  Electrocautery was used for hemostasis.  Dissection was carried down deeply within the axilla adjacent to the rib cage alternating with probing with a gamma counter.  The sentinel lymph node was found and was removed with some adjacent fatty material.  The ex vivo count was in the range of 450-500 counts per second.  The background count was less than 5 counts per second.  The left node  was submitted fresh for routine pathology.  There was no remaining palpable mass within the axilla.  Hemostasis was intact.  Subcuticular tissues were infiltrated with half percent Sensorcaine with epinephrine.  The axillary wound was closed with a running 4-0 Monocryl subcuticular suture.  Attention was turned to the partial  mastectomy wound which was further inspected and could see hemostasis was intact.  Subcuticular tissues were infiltrated with half percent Sensorcaine with epinephrine.  Subcutaneous tissues were approximated with interrupted 3-0 chromic sutures.  The skin was closed with running 4-0 Monocryl subcuticular suture.  Both wounds were treated with Dermabond.  The patient was prepared for transfer to the recovery room in satisfactory condition.  Estimated blood loss 5 cc.  Darden Amber MD

## 2017-09-27 NOTE — Anesthesia Preprocedure Evaluation (Signed)
Anesthesia Evaluation  Patient identified by MRN, date of birth, ID band Patient awake    Reviewed: Allergy & Precautions, H&P , NPO status , Patient's Chart, lab work & pertinent test results, reviewed documented beta blocker date and time   History of Anesthesia Complications (+) PONV and history of anesthetic complications  Airway Mallampati: II  TM Distance: >3 FB Neck ROM: full    Dental  (+) Teeth Intact   Pulmonary neg pulmonary ROS,    Pulmonary exam normal        Cardiovascular Exercise Tolerance: Good negative cardio ROS Normal cardiovascular exam Rate:Normal     Neuro/Psych negative neurological ROS  negative psych ROS   GI/Hepatic negative GI ROS, Neg liver ROS, GERD  Medicated,  Endo/Other  negative endocrine ROS  Renal/GU negative Renal ROS  negative genitourinary   Musculoskeletal   Abdominal   Peds  Hematology negative hematology ROS (+)   Anesthesia Other Findings   Reproductive/Obstetrics negative OB ROS                             Anesthesia Physical Anesthesia Plan  ASA: II  Anesthesia Plan: General LMA   Post-op Pain Management:    Induction:   PONV Risk Score and Plan: Dexamethasone, Midazolam, Ondansetron and Propofol infusion  Airway Management Planned:   Additional Equipment:   Intra-op Plan:   Post-operative Plan:   Informed Consent: I have reviewed the patients History and Physical, chart, labs and discussed the procedure including the risks, benefits and alternatives for the proposed anesthesia with the patient or authorized representative who has indicated his/her understanding and acceptance.     Plan Discussed with: CRNA  Anesthesia Plan Comments:         Anesthesia Quick Evaluation

## 2017-09-27 NOTE — Anesthesia Post-op Follow-up Note (Signed)
Anesthesia QCDR form completed.        

## 2017-09-27 NOTE — Discharge Instructions (Addendum)
AMBULATORY SURGERY  DISCHARGE INSTRUCTIONS   1) The drugs that you were given will stay in your system until tomorrow so for the next 24 hours you should not:  A) Drive an automobile B) Make any legal decisions C) Drink any alcoholic beverage   2) You may resume regular meals tomorrow.  Today it is better to start with liquids and gradually work up to solid foods.  You may eat anything you prefer, but it is better to start with liquids, then soup and crackers, and gradually work up to solid foods.   3) Please notify your doctor immediately if you have any unusual bleeding, trouble breathing, redness and pain at the surgery site, drainage, fever, or pain not relieved by medication. 4)   5) Your post-operative visit with Dr.                                     is: Date:                        Time:    Please call to schedule your post-operative visit.  6) Additional Instructions:       Take Tylenol or Norco if needed for pain.  Should not drive or do anything dangerous when taking Norco.  May take ibuprofen beginning tomorrow.  May shower and blot dry.  Where  bra as desired for comfort and support.

## 2017-09-28 ENCOUNTER — Encounter: Payer: Self-pay | Admitting: Surgery

## 2017-10-03 ENCOUNTER — Other Ambulatory Visit: Payer: Self-pay | Admitting: Pathology

## 2017-10-03 LAB — SURGICAL PATHOLOGY

## 2017-10-03 NOTE — Progress Notes (Unsigned)
mdt  

## 2017-10-04 NOTE — Progress Notes (Signed)
 Hematology/Oncology Consult note [facility] [phone] [phone]  Patient Care Team: Sparks, Jeffrey D, MD as PCP - General (Internal Medicine)   Name of the patient: Lacey Garcia  [id number]  08/16/1961   Date of visit: 10/04/17 REASON FOR COSULTATION:  Right breast DCIS History of presenting illness-  This is a 55-year-old female previously healthy who presented to clinic for initial visit for evaluation of recently diagnosed right breast DCIS. Patient had recent bilateral mammogram done on 08/04/2017 which demonstrated calcifications in the right breast. She had additional film on 08/19/2017 which demonstrated calcification in the posterior aspect of the upper outer quadrant of the right breast. Calcifications spanning an area of 8 mm and are associated with an asymmetric density. Patient had a steroid tactic biopsy on 09/01/2017. Pathology reveals high-grade DCIS, with comedonecrosis and calcifications. ER positive > 90%, PR positive>1%.  Patient denies palpating any breast mass or having any nipple discharge. She denies any breast tenderness or skin changes. She has a remote history of 2 episodes of early stage melanoma. First episode was back in 1993 and she received resection. Since then she has dermatology surveillance routinely and found out another early-stage melanoma which was resected again. She does any family history of cancer. She has been on OCP for many years until 3 years ago, she was taken off OCP and her menstrual period stopped 3 years ago. She denies any use of estrogen replacement. 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 Reports chronic back pain. Denies any cough, chest pain, abdominal pain.   INTERVAL HISTORY Patient present for follow-up and discussion about treatment plan status post lumpectomy on 09/27/2017. Patient reports doing well with no new complaints. Denies fever or chills.  Review of systems-  Review of Systems  Constitutional: Negative for chills.  HENT: Negative for ear pain, hearing loss and tinnitus.   Eyes: Negative for pain.  Respiratory: Negative for cough, hemoptysis and sputum production.   Cardiovascular: Negative for palpitations, orthopnea and claudication.  Gastrointestinal: Negative for heartburn, nausea and vomiting.  Genitourinary: Negative for dysuria and urgency.  Musculoskeletal: Negative for back pain.  Neurological: Negative for dizziness and headaches.  Endo/Heme/Allergies: Does not bruise/bleed easily.  Psychiatric/Behavioral: Negative for depression.   Allergies  Allergen Reactions  . Sulfa Antibiotics Hives and Rash     Past Medical History:  Diagnosis Date  . Breast cancer (HCC) 08/2017   right breast  . Cancer (HCC)    melanoma  . Complication of anesthesia   . GERD (gastroesophageal reflux disease)    rare-no meds  . History of kidney stones 1991  . Melanoma (HCC) 1993   Melanoma x 2 times.   . PONV (postoperative nausea and vomiting)     Past Surgical History:  Procedure Laterality Date  . BREAST BIOPSY Right 09/01/2017   AFFIRM Stereo/intraductal carcinoma in situ  . BREAST LUMPECTOMY Right 09/27/2017   intraductal cacinoma in situ  . coloscopy  2014  . FRACTURE SURGERY    . HAND SURGERY     trigger finger release  . leg left rod and pins    . LITHOTRIPSY    . MELANOMA EXCISION     left leg and right leg  . PARTIAL MASTECTOMY WITH NEEDLE LOCALIZATION Right 09/27/2017   Procedure: PARTIAL MASTECTOMY WITH NEEDLE LOCALIZATION;  Surgeon: Smith, Jarvis Wilton, MD;  Location: [facility] ORS;  Service: General;  Laterality: Right;  . SENTINEL NODE BIOPSY Right 09/27/2017   Procedure: SENTINEL   NODE BIOPSY;  Surgeon: Smith, Jarvis Wilton, MD;  Location: [facility] ORS;  Service: General;  Laterality: Right;    Social History   Socioeconomic History  . Marital status: Married    Spouse name: Not on file  . Number of children: Not on file    . Years of education: Not on file  . Highest education level: Not on file  Social Needs  . Financial resource strain: Not on file  . Food insecurity - worry: Not on file  . Food insecurity - inability: Not on file  . Transportation needs - medical: Not on file  . Transportation needs - non-medical: Not on file  Occupational History  . Not on file  Tobacco Use  . Smoking status: Never Smoker  . Smokeless tobacco: Never Used  . Tobacco comment: no tobacco use  Substance and Sexual Activity  . Alcohol use: No    Frequency: Never  . Drug use: No  . Sexual activity: Yes  Other Topics Concern  . Not on file  Social History Narrative  . Not on file     Family History  Problem Relation Age of Onset  . Hypertension Mother   . Heart disease Mother   . Diabetes Father   . Hypertension Father   . Heart disease Father   . Breast cancer Neg Hx      Current Outpatient Medications:  .  Cholecalciferol (VITAMIN D-3) 5000 units TABS, Take 1 tablet daily by mouth., Disp: , Rfl:  .  estradiol (ESTRACE) 0.1 MG/GM vaginal cream, Place 1 Applicatorful 3 (three) times a week vaginally., Disp: , Rfl:  .  HYDROcodone-acetaminophen (NORCO) 5-325 MG tablet, Take 1-2 tablets by mouth every 6 (six) hours as needed for moderate pain., Disp: 12 tablet, Rfl: 0 .  Omega-3 Fatty Acids (OMEGA-3 FISH OIL PO), Take 650 mg daily by mouth. PT ALREADY STOPPED FISH OIL FOR UPCOMING SURGERY, Disp: , Rfl:    Physical exam:  Vitals:   10/05/17 1125  BP: 119/69  Pulse: 77  Resp: 20  Temp: (!) 97.1 F (36.2 C)  TempSrc: Tympanic  Weight: 169 lb (76.7 kg)   Physical Exam  Constitutional: She is oriented to person, place, and time and well-developed, well-nourished, and in no distress. No distress.  HENT:  Head: Atraumatic.  Eyes: EOM are normal.  Cardiovascular: Normal rate and regular rhythm.  Pulmonary/Chest: Effort normal and breath sounds normal.  Abdominal: Soft. Bowel sounds are normal. She  exhibits no distension.  Musculoskeletal: Normal range of motion.  Neurological: She is alert and oriented to person, place, and time.  Skin: Skin is warm and dry.  Psychiatric: Affect normal.   Breasts: breasts appear normal, no suspicious masses, no skin or nipple changes or axillary nodes  Pathology 09/27/2017. SPECIMEN SUBMITTED:  A. Breast mass, right  B. Sentinel lymph node, right  C. Breast, right; lateral margins  DIAGNOSIS:  A. RIGHT BREAST MASS; NEEDLE LOCALIZED LUMPECTOMY:  - DUCTAL CARCINOMA IN SITU, NUCLEAR GRADE 3 WITH COMEDONECROSIS AND MICROCALCIFICATIONS.  - BIOPSY SITE CHANGES, MARKER CLIP PRESENT.  - THE SURGICAL MARGINS ARE CLOSE BUT NEGATIVE (INFERIOR AND LATERAL  MARGINS <1 MM).  - SEE SUMMARY BELOW.   B. RIGHT SENTINEL LYMPH NODE; EXCISION:  - NO TUMOR SEEN IN TWO LYMPH NODES (0/2).   C. RIGHT BREAST, LATERAL MARGIN;  - NO TUMOR SEEN.  - FIBROCYSTIC CHANGE.  DUCTAL CARCINOMA IN SITU OF THE BREAST:  Procedure: Needle localized lumpectomy  Specimen Laterality: Right  Size (  Extent) of DCIS: at least 36 mm  Histologic Type: Ductal carcinoma in situ (DCIS)  Nuclear Grade: 3  Necrosis: Comedo necrosis present  Margins: Close (<1 mm to inferior and lateral) but negative  Regional Lymph nodes: Uninvolved by tumor cells  Number of Lymph Nodes Examined: 2  Number of Sentinel Nodes Examined: 2  Pathologic Stage Classification (pTNM, AJCC 8th Edition): pTis pNx  TNM Descriptors: Not applicable   BREAST BIOMARKER TESTS (performed on a separate specimen):  Estrogen Receptor (ER) Status: POSITIVE, >90% nuclear staining    Average intensity of staining: Strong  Progesterone Receptor (PgR) Status: POSITIVE, 1% nuclear staining    Average intensity of staining: Strong   Assessment and plan- Patient is a 56 y.o. female who has distant history of melanoma s/p resection presented for evaluation of recently diagnosed right breast high grade DCIS status  post lumpectomy. Pathology staging pTis pN0cM0  Cancer Staging DCIS (ductal carcinoma in situ) Staging form: Breast, AJCC 8th Edition - Clinical: No stage assigned - Unsigned - Pathologic stage from 10/05/2017: Stage 0 (pTis (DCIS), pN0, cM0, G3, ER: Positive, PR: Positive, HER2: Not assessed ) - Signed by Earlie Server, MD on 10/05/2017  Pathology results was discussed with patient. Inferior and lateral margin were close at less than 1 mm. 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 I'll defer to Dr. Tamala Julian to decide if additional reexcision is feasible.   # Recommend radiation followed by  anti-estrogen adjuvant chemotherapy. Patient's postmenopausal,  Plan letrozole 2.64m daily after completion of RT, for 5 years. Rx sent to pharmacy and patient understands that she does not start until she finishes RT. Side effects of letrozole discussed with patient # obtain baseline DEXA. Patient will notify [place] is completed and will call [place] she almost finishes her RT to schedule follow up appt w me.   if osteopenia or osteoporosis, plan zometa after dental clearance.  # Advise not to use estrogen vaginal cream.   ZEarlie Server MD, PhD Hematology Oncology CSacred [facility] Hospitalat ACasa Grandesouthwestern Eye CenterPager- [phone]/28/2018

## 2017-10-05 ENCOUNTER — Encounter: Payer: Self-pay | Admitting: Oncology

## 2017-10-05 ENCOUNTER — Ambulatory Visit
Admission: RE | Admit: 2017-10-05 | Discharge: 2017-10-05 | Disposition: A | Discharge status: Home / Self Care | Payer: PRIVATE HEALTH INSURANCE | Source: Ambulatory Visit | Attending: Radiation Oncology | Admitting: Radiation Oncology

## 2017-10-05 ENCOUNTER — Other Ambulatory Visit: Payer: Self-pay

## 2017-10-05 ENCOUNTER — Encounter: Payer: Self-pay | Admitting: Radiation Oncology

## 2017-10-05 ENCOUNTER — Inpatient Hospital Stay (HOSPITAL_BASED_OUTPATIENT_CLINIC_OR_DEPARTMENT_OTHER): Payer: PRIVATE HEALTH INSURANCE | Admitting: Oncology

## 2017-10-05 VITALS — BP 119/69 | HR 77 | Temp 97.1°F | Resp 20 | Wt 169.4 lb

## 2017-10-05 VITALS — BP 119/69 | HR 77 | Temp 97.1°F | Resp 20 | Wt 169.0 lb

## 2017-10-05 DIAGNOSIS — D0510 Intraductal carcinoma in situ of unspecified breast: Secondary | ICD-10-CM | POA: Insufficient documentation

## 2017-10-05 DIAGNOSIS — Z79899 Other long term (current) drug therapy: Secondary | ICD-10-CM | POA: Diagnosis not present

## 2017-10-05 DIAGNOSIS — D0511 Intraductal carcinoma in situ of right breast: Secondary | ICD-10-CM

## 2017-10-05 DIAGNOSIS — M549 Dorsalgia, unspecified: Secondary | ICD-10-CM

## 2017-10-05 DIAGNOSIS — K219 Gastro-esophageal reflux disease without esophagitis: Secondary | ICD-10-CM | POA: Insufficient documentation

## 2017-10-05 DIAGNOSIS — Z170 Estrogen receptor positive status [ER+]: Secondary | ICD-10-CM | POA: Insufficient documentation

## 2017-10-05 DIAGNOSIS — Z85820 Personal history of malignant melanoma of skin: Secondary | ICD-10-CM | POA: Insufficient documentation

## 2017-10-05 DIAGNOSIS — Z510 Encounter for antineoplastic radiation therapy: Secondary | ICD-10-CM | POA: Diagnosis not present

## 2017-10-05 DIAGNOSIS — G8929 Other chronic pain: Secondary | ICD-10-CM | POA: Diagnosis not present

## 2017-10-05 DIAGNOSIS — Z87442 Personal history of urinary calculi: Secondary | ICD-10-CM | POA: Insufficient documentation

## 2017-10-05 MED ORDER — LETROZOLE 2.5 MG PO TABS: 2.5000 mg | ORAL_TABLET | Freq: Every day | ORAL | 3 refills | Status: DC

## 2017-10-05 NOTE — Consult Note (Signed)
NEW PATIENT EVALUATION  Name: Lacey Garcia  MRN: [id number]  Date:   10/05/2017     DOB: 12-05-1960   This 56 y.o. female patient presents to the clinic for initial evaluation of ductal carcinoma in situ ER/PR positive PR negative status post wide local excision of the right breast with close margin.  REFERRING PHYSICIAN: Idelle Crouch, MD  CHIEF COMPLAINT:  Chief Complaint  Patient presents with  . Breast Cancer    Pt is here for initial consultation of breast cancer.      DIAGNOSIS: The encounter diagnosis was Ductal carcinoma in situ (DCIS) of right breast.   PREVIOUS INVESTIGATIONS:  Mammogram and ultrasound reviewed Pathology reports reviewed Clinical notes reviewed  HPI: Patient is a 56 year old female who presented with an abnormal mammogram of her right breast. She had abnormal calcifications in the far posterior aspect of the upper outer quadrant of the right breast spanning 8 mm. There was asymmetric density. She underwent stereotactic biopsy positive for ductal carcinoma in situ ER/PR positive PR 1%. She went have a wide local excision for an area of 3.6 cm of high-grade ductal carcinoma in situ with comedonecrosis. Margins were clear but close at less than 1 mm. Sentinel lymph nodes were negative. She has done well postoperatively. She has discussed with me her feelings that she would like to avoid another surgery based on the close margins for DCIS. She otherwise is doing well.  PLANNED TREATMENT REGIMEN: Whole breast radiation with scar boost  PAST MEDICAL HISTORY:  has a past medical history of Breast cancer ([place]) (08/2017), Cancer ([place]), Complication of anesthesia, GERD (gastroesophageal reflux disease), History of kidney stones (1991), Melanoma ([place]) (1993), and PONV (postoperative nausea and vomiting).    PAST SURGICAL HISTORY:  Past Surgical History:  Procedure Laterality Date  . BREAST BIOPSY Right 09/01/2017   AFFIRM Stereo/intraductal carcinoma in situ   . BREAST LUMPECTOMY Right 09/27/2017   intraductal cacinoma in situ  . coloscopy  2014  . FRACTURE SURGERY    . HAND SURGERY     trigger finger release  . leg left rod and pins    . LITHOTRIPSY    . MELANOMA EXCISION     left leg and right leg  . PARTIAL MASTECTOMY WITH NEEDLE LOCALIZATION Right 09/27/2017   Procedure: PARTIAL MASTECTOMY WITH NEEDLE LOCALIZATION;  Surgeon: Leonie Green, MD;  Location: [facility] ORS;  Service: General;  Laterality: Right;  . SENTINEL NODE BIOPSY Right 09/27/2017   Procedure: SENTINEL NODE BIOPSY;  Surgeon: Leonie Green, MD;  Location: [facility] ORS;  Service: General;  Laterality: Right;    FAMILY HISTORY: family history includes Diabetes in her father; Heart disease in her father and mother; Hypertension in her father and mother.  SOCIAL HISTORY:  reports that  has never smoked. she has never used smokeless tobacco. She reports that she does not drink alcohol or use drugs.  ALLERGIES: Sulfa antibiotics  MEDICATIONS:  Current Outpatient Medications  Medication Sig Dispense Refill  . Cholecalciferol (VITAMIN D-3) 5000 units TABS Take 1 tablet daily by mouth.    . estradiol (ESTRACE) 0.1 MG/GM vaginal cream Place 1 Applicatorful 3 (three) times a week vaginally.    . lansoprazole (PREVACID) 30 MG capsule Take 30 mg by mouth daily at 12 noon.    . Omega-3 Fatty Acids (OMEGA-3 FISH OIL PO) Take 650 mg daily by mouth. PT ALREADY STOPPED FISH OIL FOR UPCOMING SURGERY    . HYDROcodone-acetaminophen (NORCO) 5-325 MG tablet Take 1-2 tablets  by mouth every 6 (six) hours as needed for moderate pain. (Patient not taking: Reported on 10/05/2017) 12 tablet 0   No current facility-administered medications for this encounter.     ECOG PERFORMANCE STATUS:  0 - Asymptomatic  REVIEW OF SYSTEMS:  Patient denies any weight loss, fatigue, weakness, fever, chills or night sweats. Patient denies any loss of vision, blurred vision. Patient denies any ringing  of  the ears or hearing loss. No irregular heartbeat. Patient denies heart murmur or history of fainting. Patient denies any chest pain or pain radiating to her upper extremities. Patient denies any shortness of breath, difficulty breathing at night, cough or hemoptysis. Patient denies any swelling in the lower legs. Patient denies any nausea vomiting, vomiting of blood, or coffee ground material in the vomitus. Patient denies any stomach pain. Patient states has had normal bowel movements no significant constipation or diarrhea. Patient denies any dysuria, hematuria or significant nocturia. Patient denies any problems walking, swelling in the joints or loss of balance. Patient denies any skin changes, loss of hair or loss of weight. Patient denies any excessive worrying or anxiety or significant depression. Patient denies any problems with insomnia. Patient denies excessive thirst, polyuria, polydipsia. Patient denies any swollen glands, patient denies easy bruising or easy bleeding. Patient denies any recent infections, allergies or URI. Patient "s visual fields have not changed significantly in recent time.    PHYSICAL EXAM: BP 119/69   Pulse 77   Temp (!) 97.1 F (36.2 C)   Resp 20   Wt 169 lb 6.8 oz (76.9 kg)   BMI 26.14 kg/m  She is status post wide local excision and sentinel node biopsy of the right breast. Incisions are healing well. No dominant mass or nodularity is noted in either breast in 2 positions examined. No axillary or supraclavicular adenopathy is noted. Well-developed well-nourished patient in NAD. HEENT reveals PERLA, EOMI, discs not visualized.  Oral cavity is clear. No oral mucosal lesions are identified. Neck is clear without evidence of cervical or supraclavicular adenopathy. Lungs are clear to A&P. Cardiac examination is essentially unremarkable with regular rate and rhythm without murmur rub or thrill. Abdomen is benign with no organomegaly or masses noted. Motor sensory and DTR  levels are equal and symmetric in the upper and lower extremities. Cranial nerves II through XII are grossly intact. Proprioception is intact. No peripheral adenopathy or edema is identified. No motor or sensory levels are noted. Crude visual fields are within normal range.  LABORATORY DATA: Pathology reports reviewed    RADIOLOGY RESULTS: Mammograms and ultrasound reviewed   IMPRESSION: Stage 0 (Tis N0 M0) ER positive PR borderline ductal carcinoma in situ of the right breast status post wide local excision and sentinel node biopsy in 56 year old female  PLAN: At this time I discussed with the patient possibility of avoiding further surgery based on her wishes. I would offer whole breast radiation to 5040 cGy in 28 fractions. She is large breasted which would make hypofractionated course of treatment difficult. I would also boost her scar another 1600 cGy using electron beam based on the close margin to try to avoid recurrent disease in the breast. Risks and benefits of treatment including skin reaction fatigue alteration of blood counts possible inclusion of superficial lung all were discussed in detail with the patient. She will see medical oncology today and is a candidate for antiestrogen therapy after completion of radiation. She will also have further discussion with medical oncology and surgeon about again possibility  of reexcision. I've tentatively scheduled and ordered CT simulation in about 2 weeks' time to allow further healing. Patient seems to comprehend my treatment plan well.  I would like to take this opportunity to thank you for allowing me to participate in the care of your patient.Armstead Peaks., MD

## 2017-10-05 NOTE — Progress Notes (Signed)
Patient here for follow up today. She was seen and examined by Dr. Baruch Gouty today also. She states that she is feeling well and denies having any pain. She does report soreness in the incision area. She started on Prevacid yesterday for acid reflux, prescribed by Dr. Tamala Julian.

## 2017-10-06 ENCOUNTER — Ambulatory Visit
Admission: RE | Admit: 2017-10-06 | Discharge: 2017-10-06 | Disposition: A | Discharge status: Home / Self Care | Payer: PRIVATE HEALTH INSURANCE | Source: Ambulatory Visit | Attending: Oncology | Admitting: Oncology

## 2017-10-06 DIAGNOSIS — M8588 Other specified disorders of bone density and structure, other site: Secondary | ICD-10-CM | POA: Diagnosis not present

## 2017-10-06 DIAGNOSIS — D0511 Intraductal carcinoma in situ of right breast: Secondary | ICD-10-CM

## 2017-10-06 DIAGNOSIS — M4186 Other forms of scoliosis, lumbar region: Secondary | ICD-10-CM | POA: Diagnosis not present

## 2017-10-07 ENCOUNTER — Other Ambulatory Visit: Payer: Self-pay | Admitting: Oncology

## 2017-10-07 DIAGNOSIS — D0510 Intraductal carcinoma in situ of unspecified breast: Secondary | ICD-10-CM

## 2017-10-07 NOTE — Progress Notes (Signed)
DEXA results discussed with patient over the phone.  She has osteopenia and I offered her zometa 4mg  IV twice a year. She would like to wait until she is started on Letrozole and then decide.  She will also obtain dental clearance if she decide to proceed with treatment.

## 2017-10-12 NOTE — Progress Notes (Signed)
  Oncology Nurse Navigator Documentation  Navigator Location: [facility] (10/05/17 1256)   )        Surgery Date: 09/27/17 (10/05/17 1256)             Patient Visit Type: MedOnc;Post-op Appt;RadOnc (10/05/17 1256)   Barriers/Navigation Needs: Education;Coordination of Care (10/05/17 1256) Education: Preparing for Upcoming Surgery/ Treatment;Understanding Cancer/ Treatment Options (10/05/17 1256) Interventions: Coordination of Care;Education (10/05/17 1256)     Education Method: Verbal;Written (10/05/17 1256)                Time Spent with Patient: 60 (10/05/17 1256)   Met with patient during follow-up with Dr. Tasia Catchings, and Dr. Baruch Gouty.  Patient requests assistance with appointment times.  Radiation times with preference reviewed with Larene Beach Horner.  Patient given information on questions about cost of radiation.  Discussed with financial advocate. Explained to patient that estimate may be obtained after orders for her specific treatment are submitted.

## 2017-10-13 NOTE — Progress Notes (Signed)
  Oncology Nurse Navigator Documentation  Navigator Location: [facility] (10/13/17 0800)   )Navigator Encounter Type: Telephone (10/13/17 0800) Telephone: Incoming Call;Outgoing Call;Appt Confirmation/Clarification (10/13/17 0800)                                                  Time Spent with Patient: 15 (10/13/17 0800)

## 2017-10-14 ENCOUNTER — Ambulatory Visit
Admission: RE | Admit: 2017-10-14 | Discharge: 2017-10-14 | Disposition: A | Discharge status: Home / Self Care | Payer: PRIVATE HEALTH INSURANCE | Source: Ambulatory Visit | Attending: Radiation Oncology | Admitting: Radiation Oncology

## 2017-10-14 DIAGNOSIS — Z510 Encounter for antineoplastic radiation therapy: Secondary | ICD-10-CM | POA: Diagnosis not present

## 2017-10-17 ENCOUNTER — Ambulatory Visit: Payer: PRIVATE HEALTH INSURANCE

## 2017-10-19 DIAGNOSIS — Z510 Encounter for antineoplastic radiation therapy: Secondary | ICD-10-CM | POA: Diagnosis not present

## 2017-10-21 ENCOUNTER — Other Ambulatory Visit: Payer: Self-pay | Admitting: *Deleted

## 2017-10-21 DIAGNOSIS — D0511 Intraductal carcinoma in situ of right breast: Secondary | ICD-10-CM

## 2017-10-24 ENCOUNTER — Ambulatory Visit
Admission: RE | Admit: 2017-10-24 | Discharge: 2017-10-24 | Disposition: A | Discharge status: Home / Self Care | Payer: PRIVATE HEALTH INSURANCE | Source: Ambulatory Visit | Attending: Radiation Oncology | Admitting: Radiation Oncology

## 2017-10-24 DIAGNOSIS — Z510 Encounter for antineoplastic radiation therapy: Secondary | ICD-10-CM | POA: Diagnosis not present

## 2017-10-25 ENCOUNTER — Ambulatory Visit
Admission: RE | Admit: 2017-10-25 | Discharge: 2017-10-25 | Disposition: A | Discharge status: Home / Self Care | Payer: PRIVATE HEALTH INSURANCE | Source: Ambulatory Visit | Attending: Radiation Oncology | Admitting: Radiation Oncology

## 2017-10-25 DIAGNOSIS — Z510 Encounter for antineoplastic radiation therapy: Secondary | ICD-10-CM | POA: Diagnosis not present

## 2017-10-26 ENCOUNTER — Ambulatory Visit
Admission: RE | Admit: 2017-10-26 | Discharge: 2017-10-26 | Disposition: A | Discharge status: Home / Self Care | Payer: PRIVATE HEALTH INSURANCE | Source: Ambulatory Visit | Attending: Radiation Oncology | Admitting: Radiation Oncology

## 2017-10-26 DIAGNOSIS — Z510 Encounter for antineoplastic radiation therapy: Secondary | ICD-10-CM | POA: Diagnosis not present

## 2017-10-27 ENCOUNTER — Ambulatory Visit
Admission: RE | Admit: 2017-10-27 | Discharge: 2017-10-27 | Disposition: A | Discharge status: Home / Self Care | Payer: PRIVATE HEALTH INSURANCE | Source: Ambulatory Visit | Attending: Radiation Oncology | Admitting: Radiation Oncology

## 2017-10-27 DIAGNOSIS — Z510 Encounter for antineoplastic radiation therapy: Secondary | ICD-10-CM | POA: Diagnosis not present

## 2017-10-28 ENCOUNTER — Ambulatory Visit
Admission: RE | Admit: 2017-10-28 | Discharge: 2017-10-28 | Disposition: A | Discharge status: Home / Self Care | Payer: PRIVATE HEALTH INSURANCE | Source: Ambulatory Visit | Attending: Radiation Oncology | Admitting: Radiation Oncology

## 2017-10-28 DIAGNOSIS — Z510 Encounter for antineoplastic radiation therapy: Secondary | ICD-10-CM | POA: Diagnosis not present

## 2017-10-31 ENCOUNTER — Ambulatory Visit
Admission: RE | Admit: 2017-10-31 | Discharge: 2017-10-31 | Disposition: A | Discharge status: Home / Self Care | Payer: PRIVATE HEALTH INSURANCE | Source: Ambulatory Visit | Attending: Radiation Oncology | Admitting: Radiation Oncology

## 2017-10-31 DIAGNOSIS — Z510 Encounter for antineoplastic radiation therapy: Secondary | ICD-10-CM | POA: Diagnosis not present

## 2017-11-02 ENCOUNTER — Ambulatory Visit
Admission: RE | Admit: 2017-11-02 | Discharge: 2017-11-02 | Disposition: A | Discharge status: Home / Self Care | Payer: PRIVATE HEALTH INSURANCE | Source: Ambulatory Visit | Attending: Radiation Oncology | Admitting: Radiation Oncology

## 2017-11-02 DIAGNOSIS — Z510 Encounter for antineoplastic radiation therapy: Secondary | ICD-10-CM | POA: Diagnosis not present

## 2017-11-03 ENCOUNTER — Ambulatory Visit
Admission: RE | Admit: 2017-11-03 | Discharge: 2017-11-03 | Disposition: A | Discharge status: Home / Self Care | Payer: PRIVATE HEALTH INSURANCE | Source: Ambulatory Visit | Attending: Radiation Oncology | Admitting: Radiation Oncology

## 2017-11-03 DIAGNOSIS — Z510 Encounter for antineoplastic radiation therapy: Secondary | ICD-10-CM | POA: Diagnosis not present

## 2017-11-04 ENCOUNTER — Ambulatory Visit
Admission: RE | Admit: 2017-11-04 | Discharge: 2017-11-04 | Disposition: A | Discharge status: Home / Self Care | Payer: PRIVATE HEALTH INSURANCE | Source: Ambulatory Visit | Attending: Radiation Oncology | Admitting: Radiation Oncology

## 2017-11-04 ENCOUNTER — Inpatient Hospital Stay: Payer: PRIVATE HEALTH INSURANCE | Attending: Radiation Oncology

## 2017-11-04 DIAGNOSIS — Z510 Encounter for antineoplastic radiation therapy: Secondary | ICD-10-CM | POA: Diagnosis not present

## 2017-11-04 DIAGNOSIS — D0510 Intraductal carcinoma in situ of unspecified breast: Secondary | ICD-10-CM

## 2017-11-04 DIAGNOSIS — D0511 Intraductal carcinoma in situ of right breast: Secondary | ICD-10-CM | POA: Diagnosis not present

## 2017-11-04 LAB — CBC WITH DIFFERENTIAL/PLATELET
BASOS PCT: 1 %
Basophils Absolute: 0 10*3/uL (ref 0–0.1)
Eosinophils Absolute: 0.1 10*3/uL (ref 0–0.7)
Eosinophils Relative: 1 %
HCT: 38.2 % (ref 35.0–47.0)
Hemoglobin: 12.8 g/dL (ref 12.0–16.0)
Lymphocytes Relative: 33 %
Lymphs Abs: 1.8 10*3/uL (ref 1.0–3.6)
MCH: 31.1 pg (ref 26.0–34.0)
MCHC: 33.6 g/dL (ref 32.0–36.0)
MCV: 92.6 fL (ref 80.0–100.0)
MONOS PCT: 8 %
Monocytes Absolute: 0.5 10*3/uL (ref 0.2–0.9)
Neutro Abs: 3.1 10*3/uL (ref 1.4–6.5)
Neutrophils Relative %: 57 %
Platelets: 249 10*3/uL (ref 150–440)
RBC: 4.12 MIL/uL (ref 3.80–5.20)
RDW: 13.8 % (ref 11.5–14.5)
WBC: 5.4 10*3/uL (ref 3.6–11.0)

## 2017-11-04 LAB — COMPREHENSIVE METABOLIC PANEL
ALBUMIN: 4.4 g/dL (ref 3.5–5.0)
ALT: 12 U/L — ABNORMAL LOW (ref 14–54)
ANION GAP: 9 (ref 5–15)
AST: 12 U/L — ABNORMAL LOW (ref 15–41)
Alkaline Phosphatase: 83 U/L (ref 38–126)
BILIRUBIN TOTAL: 0.7 mg/dL (ref 0.3–1.2)
BUN: 17 mg/dL (ref 6–20)
CO2: 28 mmol/L (ref 22–32)
Calcium: 9.3 mg/dL (ref 8.9–10.3)
Chloride: 102 mmol/L (ref 101–111)
Creatinine, Ser: 0.55 mg/dL (ref 0.44–1.00)
GFR calc non Af Amer: >60 mL/min (ref >60)
GLUCOSE: 104 mg/dL — AB (ref 65–99)
Potassium: 3.9 mmol/L (ref 3.5–5.1)
Sodium: 139 mmol/L (ref 135–145)
TOTAL PROTEIN: 7.6 g/dL (ref 6.5–8.1)

## 2017-11-07 ENCOUNTER — Ambulatory Visit
Admission: RE | Admit: 2017-11-07 | Discharge: 2017-11-07 | Disposition: A | Discharge status: Home / Self Care | Payer: PRIVATE HEALTH INSURANCE | Source: Ambulatory Visit | Attending: Radiation Oncology | Admitting: Radiation Oncology

## 2017-11-07 DIAGNOSIS — Z510 Encounter for antineoplastic radiation therapy: Secondary | ICD-10-CM | POA: Diagnosis not present

## 2017-11-09 ENCOUNTER — Ambulatory Visit
Admission: RE | Admit: 2017-11-09 | Discharge: 2017-11-09 | Disposition: A | Discharge status: Home / Self Care | Payer: PRIVATE HEALTH INSURANCE | Source: Ambulatory Visit | Attending: Radiation Oncology | Admitting: Radiation Oncology

## 2017-11-09 DIAGNOSIS — Z510 Encounter for antineoplastic radiation therapy: Secondary | ICD-10-CM | POA: Diagnosis not present

## 2017-11-10 ENCOUNTER — Ambulatory Visit
Admission: RE | Admit: 2017-11-10 | Discharge: 2017-11-10 | Disposition: A | Discharge status: Home / Self Care | Payer: PRIVATE HEALTH INSURANCE | Source: Ambulatory Visit | Attending: Radiation Oncology | Admitting: Radiation Oncology

## 2017-11-10 DIAGNOSIS — Z510 Encounter for antineoplastic radiation therapy: Secondary | ICD-10-CM | POA: Diagnosis not present

## 2017-11-11 ENCOUNTER — Ambulatory Visit
Admission: RE | Admit: 2017-11-11 | Discharge: 2017-11-11 | Disposition: A | Discharge status: Home / Self Care | Payer: PRIVATE HEALTH INSURANCE | Source: Ambulatory Visit | Attending: Radiation Oncology | Admitting: Radiation Oncology

## 2017-11-11 DIAGNOSIS — Z510 Encounter for antineoplastic radiation therapy: Secondary | ICD-10-CM | POA: Diagnosis not present

## 2017-11-14 ENCOUNTER — Ambulatory Visit
Admission: RE | Admit: 2017-11-14 | Discharge: 2017-11-14 | Disposition: A | Discharge status: Home / Self Care | Payer: PRIVATE HEALTH INSURANCE | Source: Ambulatory Visit | Attending: Radiation Oncology | Admitting: Radiation Oncology

## 2017-11-14 DIAGNOSIS — Z510 Encounter for antineoplastic radiation therapy: Secondary | ICD-10-CM | POA: Diagnosis not present

## 2017-11-15 ENCOUNTER — Ambulatory Visit
Admission: RE | Admit: 2017-11-15 | Discharge: 2017-11-15 | Disposition: A | Discharge status: Home / Self Care | Payer: PRIVATE HEALTH INSURANCE | Source: Ambulatory Visit | Attending: Radiation Oncology | Admitting: Radiation Oncology

## 2017-11-15 DIAGNOSIS — Z510 Encounter for antineoplastic radiation therapy: Secondary | ICD-10-CM | POA: Diagnosis not present

## 2017-11-16 ENCOUNTER — Ambulatory Visit
Admission: RE | Admit: 2017-11-16 | Discharge: 2017-11-16 | Disposition: A | Discharge status: Home / Self Care | Payer: PRIVATE HEALTH INSURANCE | Source: Ambulatory Visit | Attending: Radiation Oncology | Admitting: Radiation Oncology

## 2017-11-16 DIAGNOSIS — Z510 Encounter for antineoplastic radiation therapy: Secondary | ICD-10-CM | POA: Diagnosis not present

## 2017-11-17 ENCOUNTER — Inpatient Hospital Stay: Payer: PRIVATE HEALTH INSURANCE | Attending: Radiation Oncology

## 2017-11-17 ENCOUNTER — Ambulatory Visit
Admission: RE | Admit: 2017-11-17 | Discharge: 2017-11-17 | Disposition: A | Discharge status: Home / Self Care | Payer: PRIVATE HEALTH INSURANCE | Source: Ambulatory Visit | Attending: Radiation Oncology | Admitting: Radiation Oncology

## 2017-11-17 DIAGNOSIS — D0511 Intraductal carcinoma in situ of right breast: Secondary | ICD-10-CM | POA: Insufficient documentation

## 2017-11-17 DIAGNOSIS — Z510 Encounter for antineoplastic radiation therapy: Secondary | ICD-10-CM | POA: Diagnosis not present

## 2017-11-17 LAB — CBC
HEMATOCRIT: 38.1 % (ref 35.0–47.0)
HEMOGLOBIN: 12.5 g/dL (ref 12.0–16.0)
MCH: 30.7 pg (ref 26.0–34.0)
MCHC: 32.8 g/dL (ref 32.0–36.0)
MCV: 93.7 fL (ref 80.0–100.0)
Platelets: 222 10*3/uL (ref 150–440)
RBC: 4.07 MIL/uL (ref 3.80–5.20)
RDW: 13.7 % (ref 11.5–14.5)
WBC: 4.5 10*3/uL (ref 3.6–11.0)

## 2017-11-18 ENCOUNTER — Ambulatory Visit
Admission: RE | Admit: 2017-11-18 | Discharge: 2017-11-18 | Disposition: A | Discharge status: Home / Self Care | Payer: PRIVATE HEALTH INSURANCE | Source: Ambulatory Visit | Attending: Radiation Oncology | Admitting: Radiation Oncology

## 2017-11-18 ENCOUNTER — Inpatient Hospital Stay: Payer: PRIVATE HEALTH INSURANCE

## 2017-11-18 DIAGNOSIS — Z510 Encounter for antineoplastic radiation therapy: Secondary | ICD-10-CM | POA: Diagnosis not present

## 2017-11-21 ENCOUNTER — Ambulatory Visit
Admission: RE | Admit: 2017-11-21 | Discharge: 2017-11-21 | Disposition: A | Discharge status: Home / Self Care | Payer: PRIVATE HEALTH INSURANCE | Source: Ambulatory Visit | Attending: Radiation Oncology | Admitting: Radiation Oncology

## 2017-11-21 DIAGNOSIS — Z510 Encounter for antineoplastic radiation therapy: Secondary | ICD-10-CM | POA: Diagnosis not present

## 2017-11-22 ENCOUNTER — Ambulatory Visit
Admission: RE | Admit: 2017-11-22 | Discharge: 2017-11-22 | Disposition: A | Discharge status: Home / Self Care | Payer: PRIVATE HEALTH INSURANCE | Source: Ambulatory Visit | Attending: Radiation Oncology | Admitting: Radiation Oncology

## 2017-11-22 DIAGNOSIS — Z510 Encounter for antineoplastic radiation therapy: Secondary | ICD-10-CM | POA: Diagnosis not present

## 2017-11-23 ENCOUNTER — Ambulatory Visit
Admission: RE | Admit: 2017-11-23 | Discharge: 2017-11-23 | Disposition: A | Discharge status: Home / Self Care | Payer: PRIVATE HEALTH INSURANCE | Source: Ambulatory Visit | Attending: Radiation Oncology | Admitting: Radiation Oncology

## 2017-11-23 DIAGNOSIS — Z510 Encounter for antineoplastic radiation therapy: Secondary | ICD-10-CM | POA: Diagnosis not present

## 2017-11-24 ENCOUNTER — Ambulatory Visit
Admission: RE | Admit: 2017-11-24 | Discharge: 2017-11-24 | Disposition: A | Discharge status: Home / Self Care | Payer: PRIVATE HEALTH INSURANCE | Source: Ambulatory Visit | Attending: Radiation Oncology | Admitting: Radiation Oncology

## 2017-11-24 DIAGNOSIS — Z510 Encounter for antineoplastic radiation therapy: Secondary | ICD-10-CM | POA: Diagnosis not present

## 2017-11-25 ENCOUNTER — Ambulatory Visit
Admission: RE | Admit: 2017-11-25 | Discharge: 2017-11-25 | Disposition: A | Discharge status: Home / Self Care | Payer: PRIVATE HEALTH INSURANCE | Source: Ambulatory Visit | Attending: Radiation Oncology | Admitting: Radiation Oncology

## 2017-11-25 DIAGNOSIS — Z510 Encounter for antineoplastic radiation therapy: Secondary | ICD-10-CM | POA: Diagnosis not present

## 2017-11-28 ENCOUNTER — Ambulatory Visit
Admission: RE | Admit: 2017-11-28 | Discharge: 2017-11-28 | Disposition: A | Discharge status: Home / Self Care | Payer: PRIVATE HEALTH INSURANCE | Source: Ambulatory Visit | Attending: Radiation Oncology | Admitting: Radiation Oncology

## 2017-11-28 DIAGNOSIS — Z510 Encounter for antineoplastic radiation therapy: Secondary | ICD-10-CM | POA: Diagnosis not present

## 2017-11-29 ENCOUNTER — Ambulatory Visit
Admission: RE | Admit: 2017-11-29 | Discharge: 2017-11-29 | Disposition: A | Discharge status: Home / Self Care | Payer: PRIVATE HEALTH INSURANCE | Source: Ambulatory Visit | Attending: Radiation Oncology | Admitting: Radiation Oncology

## 2017-11-29 DIAGNOSIS — Z510 Encounter for antineoplastic radiation therapy: Secondary | ICD-10-CM | POA: Diagnosis not present

## 2017-11-30 ENCOUNTER — Ambulatory Visit
Admission: RE | Admit: 2017-11-30 | Discharge: 2017-11-30 | Disposition: A | Discharge status: Home / Self Care | Payer: PRIVATE HEALTH INSURANCE | Source: Ambulatory Visit | Attending: Radiation Oncology | Admitting: Radiation Oncology

## 2017-11-30 DIAGNOSIS — Z510 Encounter for antineoplastic radiation therapy: Secondary | ICD-10-CM | POA: Diagnosis not present

## 2017-12-01 ENCOUNTER — Ambulatory Visit
Admission: RE | Admit: 2017-12-01 | Discharge: 2017-12-01 | Disposition: A | Discharge status: Home / Self Care | Payer: PRIVATE HEALTH INSURANCE | Source: Ambulatory Visit | Attending: Radiation Oncology | Admitting: Radiation Oncology

## 2017-12-01 DIAGNOSIS — Z510 Encounter for antineoplastic radiation therapy: Secondary | ICD-10-CM | POA: Diagnosis not present

## 2017-12-02 ENCOUNTER — Inpatient Hospital Stay: Payer: PRIVATE HEALTH INSURANCE

## 2017-12-02 ENCOUNTER — Ambulatory Visit
Admission: RE | Admit: 2017-12-02 | Discharge: 2017-12-02 | Disposition: A | Discharge status: Home / Self Care | Payer: PRIVATE HEALTH INSURANCE | Source: Ambulatory Visit | Attending: Radiation Oncology | Admitting: Radiation Oncology

## 2017-12-02 DIAGNOSIS — Z510 Encounter for antineoplastic radiation therapy: Secondary | ICD-10-CM | POA: Diagnosis not present

## 2017-12-02 DIAGNOSIS — D0511 Intraductal carcinoma in situ of right breast: Secondary | ICD-10-CM

## 2017-12-02 LAB — CBC
HCT: 38.3 % (ref 35.0–47.0)
Hemoglobin: 12.6 g/dL (ref 12.0–16.0)
MCH: 30.5 pg (ref 26.0–34.0)
MCHC: 32.8 g/dL (ref 32.0–36.0)
MCV: 92.8 fL (ref 80.0–100.0)
PLATELETS: 226 10*3/uL (ref 150–440)
RBC: 4.12 MIL/uL (ref 3.80–5.20)
RDW: 13.8 % (ref 11.5–14.5)
WBC: 4.9 10*3/uL (ref 3.6–11.0)

## 2017-12-05 ENCOUNTER — Other Ambulatory Visit: Payer: Self-pay | Admitting: *Deleted

## 2017-12-05 ENCOUNTER — Ambulatory Visit
Admission: RE | Admit: 2017-12-05 | Discharge: 2017-12-05 | Disposition: A | Discharge status: Home / Self Care | Payer: PRIVATE HEALTH INSURANCE | Source: Ambulatory Visit | Attending: Radiation Oncology | Admitting: Radiation Oncology

## 2017-12-05 DIAGNOSIS — Z510 Encounter for antineoplastic radiation therapy: Secondary | ICD-10-CM | POA: Diagnosis not present

## 2017-12-05 MED ORDER — SILVER SULFADIAZINE 1 % EX CREA: 1.0000 "application " | TOPICAL_CREAM | Freq: Two times a day (BID) | CUTANEOUS | 3 refills | Status: DC

## 2017-12-06 ENCOUNTER — Ambulatory Visit
Admission: RE | Admit: 2017-12-06 | Discharge: 2017-12-06 | Disposition: A | Discharge status: Home / Self Care | Payer: PRIVATE HEALTH INSURANCE | Source: Ambulatory Visit | Attending: Radiation Oncology | Admitting: Radiation Oncology

## 2017-12-06 ENCOUNTER — Other Ambulatory Visit: Payer: Self-pay | Admitting: *Deleted

## 2017-12-06 DIAGNOSIS — Z510 Encounter for antineoplastic radiation therapy: Secondary | ICD-10-CM | POA: Diagnosis not present

## 2017-12-07 ENCOUNTER — Ambulatory Visit
Admission: RE | Admit: 2017-12-07 | Discharge: 2017-12-07 | Disposition: A | Discharge status: Home / Self Care | Payer: PRIVATE HEALTH INSURANCE | Source: Ambulatory Visit | Attending: Radiation Oncology | Admitting: Radiation Oncology

## 2017-12-07 DIAGNOSIS — Z510 Encounter for antineoplastic radiation therapy: Secondary | ICD-10-CM | POA: Diagnosis not present

## 2017-12-08 ENCOUNTER — Ambulatory Visit
Admission: RE | Admit: 2017-12-08 | Discharge: 2017-12-08 | Disposition: A | Discharge status: Home / Self Care | Payer: PRIVATE HEALTH INSURANCE | Source: Ambulatory Visit | Attending: Radiation Oncology | Admitting: Radiation Oncology

## 2017-12-08 DIAGNOSIS — Z510 Encounter for antineoplastic radiation therapy: Secondary | ICD-10-CM | POA: Diagnosis not present

## 2017-12-09 ENCOUNTER — Ambulatory Visit
Admission: RE | Admit: 2017-12-09 | Discharge: 2017-12-09 | Disposition: A | Discharge status: Home / Self Care | Payer: PRIVATE HEALTH INSURANCE | Source: Ambulatory Visit | Attending: Radiation Oncology | Admitting: Radiation Oncology

## 2017-12-12 ENCOUNTER — Ambulatory Visit
Admission: RE | Admit: 2017-12-12 | Discharge: 2017-12-12 | Disposition: A | Discharge status: Home / Self Care | Payer: PRIVATE HEALTH INSURANCE | Source: Ambulatory Visit | Attending: Radiation Oncology | Admitting: Radiation Oncology

## 2017-12-12 DIAGNOSIS — Z510 Encounter for antineoplastic radiation therapy: Secondary | ICD-10-CM | POA: Diagnosis not present

## 2017-12-13 ENCOUNTER — Ambulatory Visit: Payer: PRIVATE HEALTH INSURANCE

## 2017-12-14 ENCOUNTER — Ambulatory Visit
Admission: RE | Admit: 2017-12-14 | Discharge: 2017-12-14 | Disposition: A | Discharge status: Home / Self Care | Payer: PRIVATE HEALTH INSURANCE | Source: Ambulatory Visit | Attending: Radiation Oncology | Admitting: Radiation Oncology

## 2017-12-14 DIAGNOSIS — Z510 Encounter for antineoplastic radiation therapy: Secondary | ICD-10-CM | POA: Diagnosis not present

## 2017-12-15 ENCOUNTER — Ambulatory Visit
Admission: RE | Admit: 2017-12-15 | Discharge: 2017-12-15 | Disposition: A | Discharge status: Home / Self Care | Payer: PRIVATE HEALTH INSURANCE | Source: Ambulatory Visit | Attending: Radiation Oncology | Admitting: Radiation Oncology

## 2017-12-15 ENCOUNTER — Ambulatory Visit: Payer: PRIVATE HEALTH INSURANCE

## 2017-12-15 DIAGNOSIS — Z510 Encounter for antineoplastic radiation therapy: Secondary | ICD-10-CM | POA: Diagnosis not present

## 2017-12-16 ENCOUNTER — Ambulatory Visit
Admission: RE | Admit: 2017-12-16 | Discharge: 2017-12-16 | Disposition: A | Discharge status: Home / Self Care | Payer: PRIVATE HEALTH INSURANCE | Source: Ambulatory Visit | Attending: Radiation Oncology | Admitting: Radiation Oncology

## 2017-12-16 DIAGNOSIS — Z510 Encounter for antineoplastic radiation therapy: Secondary | ICD-10-CM | POA: Diagnosis not present

## 2018-01-05 ENCOUNTER — Inpatient Hospital Stay: Payer: PRIVATE HEALTH INSURANCE | Attending: Oncology | Admitting: Oncology

## 2018-01-05 ENCOUNTER — Encounter: Payer: Self-pay | Admitting: Oncology

## 2018-01-05 VITALS — BP 126/67 | HR 96 | Temp 98.4°F | Ht 65.75 in | Wt 174.1 lb

## 2018-01-05 DIAGNOSIS — Z87440 Personal history of urinary (tract) infections: Secondary | ICD-10-CM | POA: Diagnosis not present

## 2018-01-05 DIAGNOSIS — Z79899 Other long term (current) drug therapy: Secondary | ICD-10-CM | POA: Diagnosis not present

## 2018-01-05 DIAGNOSIS — Z85820 Personal history of malignant melanoma of skin: Secondary | ICD-10-CM | POA: Diagnosis not present

## 2018-01-05 DIAGNOSIS — G8929 Other chronic pain: Secondary | ICD-10-CM | POA: Diagnosis not present

## 2018-01-05 DIAGNOSIS — M8580 Other specified disorders of bone density and structure, unspecified site: Secondary | ICD-10-CM | POA: Insufficient documentation

## 2018-01-05 DIAGNOSIS — K219 Gastro-esophageal reflux disease without esophagitis: Secondary | ICD-10-CM | POA: Insufficient documentation

## 2018-01-05 DIAGNOSIS — Z9011 Acquired absence of right breast and nipple: Secondary | ICD-10-CM | POA: Diagnosis not present

## 2018-01-05 DIAGNOSIS — M545 Low back pain: Secondary | ICD-10-CM

## 2018-01-05 DIAGNOSIS — Z170 Estrogen receptor positive status [ER+]: Secondary | ICD-10-CM

## 2018-01-05 DIAGNOSIS — D0511 Intraductal carcinoma in situ of right breast: Secondary | ICD-10-CM | POA: Diagnosis not present

## 2018-01-05 DIAGNOSIS — Z79811 Long term (current) use of aromatase inhibitors: Secondary | ICD-10-CM | POA: Diagnosis not present

## 2018-01-05 NOTE — Progress Notes (Signed)
Hematology/Oncology Consult note [facility] [phone] [phone]  Patient Care Team: Idelle Crouch, MD as PCP - General (Internal Medicine)   Name of the patient: Lacey Garcia  [id number]  1961-04-01   Date of visit: 01/05/18 REASON FOR COSULTATION:  Right breast DCIS History of presenting illness-  This is a 57 year old female previously healthy who presented to clinic for initial visit for evaluation of recently diagnosed right breast DCIS. Patient had recent bilateral mammogram done on 08/04/2017 which demonstrated calcifications in the right breast. She had additional film on 08/19/2017 which demonstrated calcification in the posterior aspect of the upper outer quadrant of the right breast. Calcifications spanning an area of 8 mm and are associated with an asymmetric density. Patient had a steroid tactic biopsy on 09/01/2017. Pathology reveals high-grade DCIS, with comedonecrosis and calcifications. ER positive > 90%, PR positive>1%.  Patient denies palpating any breast mass or having any nipple discharge. She denies any breast tenderness or skin changes. She has a remote history of 2 episodes of early stage melanoma. First episode was back in 1993 and she received resection. Since then she has dermatology surveillance routinely and found out another early-stage melanoma which was resected again. She does any family history of cancer. She has been on OCP for many years until 3 years ago, she was taken off OCP and her menstrual period stopped 3 years ago. She denies any use of estrogen replacement. 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 Reports chronic back pain. Denies any cough, chest pain, abdominal pain.   INTERVAL HISTORY Patient present for follow-up and discussion about treatment for DCIS. S.p lumpectomy and adjuvant radiation. Inferior and lateral margin were close at less than 1 mm. Ideally > 2 mm  margin is  recommended for DCIS. Consideration for reexcision of the inferior margin was discussed however after discussion the patient preferred not to have any more surgery. She has had skin toxicity from RT but now almost resolved.  Has not started on Letrozole yet.   Review of systems- Review of Systems  Constitutional: Negative for chills, diaphoresis, fever, malaise/fatigue and weight loss.  HENT: Negative for ear pain, hearing loss, nosebleeds and tinnitus.   Eyes: Negative for photophobia and pain.  Respiratory: Negative for cough, hemoptysis and sputum production.   Cardiovascular: Negative for palpitations, orthopnea and claudication.  Gastrointestinal: Negative for constipation, heartburn, nausea and vomiting.  Genitourinary: Negative for dysuria and urgency.  Musculoskeletal: Negative for back pain.  Neurological: Negative for dizziness, tingling, sensory change and headaches.  Endo/Heme/Allergies: Does not bruise/bleed easily.  Psychiatric/Behavioral: Negative for depression. The patient is not nervous/anxious.    Allergies  Allergen Reactions  . Sulfa Antibiotics Hives and Rash     Past Medical History:  Diagnosis Date  . Breast cancer ([place]) 08/2017   right breast  . Cancer ([place])    melanoma  . Complication of anesthesia   . GERD (gastroesophageal reflux disease)    rare-no meds  . History of kidney stones 1991  . Melanoma ([place]) 1993   Melanoma x 2 times.   Marland Kitchen PONV (postoperative nausea and vomiting)     Past Surgical History:  Procedure Laterality Date  . BREAST BIOPSY Right 09/01/2017   AFFIRM Stereo/intraductal carcinoma in situ  . BREAST LUMPECTOMY Right 09/27/2017   intraductal cacinoma in situ  . coloscopy  2014  . FRACTURE SURGERY    . HAND SURGERY     trigger finger release  .  leg left rod and pins    . LITHOTRIPSY    . MELANOMA EXCISION     left leg and right leg  . PARTIAL MASTECTOMY WITH NEEDLE LOCALIZATION Right 09/27/2017   Procedure: PARTIAL  MASTECTOMY WITH NEEDLE LOCALIZATION;  Surgeon: Leonie Green, MD;  Location: [facility] ORS;  Service: General;  Laterality: Right;  . SENTINEL NODE BIOPSY Right 09/27/2017   Procedure: SENTINEL NODE BIOPSY;  Surgeon: Leonie Green, MD;  Location: [facility] ORS;  Service: General;  Laterality: Right;    Social History   Socioeconomic History  . Marital status: Married    Spouse name: Not on file  . Number of children: Not on file  . Years of education: Not on file  . Highest education level: Not on file  Social Needs  . Financial resource strain: Not on file  . Food insecurity - worry: Not on file  . Food insecurity - inability: Not on file  . Transportation needs - medical: Not on file  . Transportation needs - non-medical: Not on file  Occupational History  . Not on file  Tobacco Use  . Smoking status: Never Smoker  . Smokeless tobacco: Never Used  . Tobacco comment: no tobacco use  Substance and Sexual Activity  . Alcohol use: No    Frequency: Never  . Drug use: No  . Sexual activity: Yes  Other Topics Concern  . Not on file  Social History Narrative  . Not on file     Family History  Problem Relation Age of Onset  . Hypertension Mother   . Heart disease Mother   . Diabetes Father   . Hypertension Father   . Heart disease Father   . Breast cancer Neg Hx      Current Outpatient Medications:  .  Cholecalciferol (VITAMIN D-3) 5000 units TABS, Take 1 tablet daily by mouth., Disp: , Rfl:  .  estradiol (ESTRACE) 0.1 MG/GM vaginal cream, Place 1 Applicatorful 3 (three) times a week vaginally., Disp: , Rfl:  .  fluticasone (FLONASE) 50 MCG/ACT nasal spray, Place 1 spray into both nostrils daily., Disp: , Rfl:  .  Omega-3 Fatty Acids (OMEGA-3 FISH OIL PO), Take 650 mg daily by mouth. PT ALREADY STOPPED FISH OIL FOR UPCOMING SURGERY, Disp: , Rfl:  .  ibuprofen (ADVIL,MOTRIN) 200 MG tablet, Take 200 mg by mouth every 6 (six) hours as needed., Disp: , Rfl:  .   lansoprazole (PREVACID) 30 MG capsule, Take 30 mg by mouth daily at 12 noon., Disp: , Rfl:  .  letrozole (FEMARA) 2.5 MG tablet, Take 1 tablet (2.5 mg total) by mouth daily. (Patient not taking: Reported on 01/05/2018), Disp: 30 tablet, Rfl: 3 .  silver sulfADIAZINE (SILVADENE) 1 % cream, Apply 1 application topically 2 (two) times daily. (Patient not taking: Reported on 01/05/2018), Disp: 50 g, Rfl: 3   Physical exam:  Vitals:   01/05/18 1434  BP: 126/67  Pulse: 96  Temp: 98.4 F (36.9 C)  TempSrc: Oral  Weight: 174 lb 1.6 oz (79 kg)  Height: 5' 5.75" (1.67 m)   Physical Exam  Constitutional: She is oriented to person, place, and time and well-developed, well-nourished, and in no distress. No distress.  HENT:  Head: Atraumatic.  Mouth/Throat: Oropharynx is clear and moist. No oropharyngeal exudate.  Eyes: EOM are normal. No scleral icterus.  Cardiovascular: Normal rate and regular rhythm.  No murmur heard. Pulmonary/Chest: Effort normal and breath sounds normal. No respiratory distress. She has no wheezes.  Abdominal: Soft. Bowel sounds are normal. She exhibits no distension. There is no rebound and no guarding.  Musculoskeletal: Normal range of motion. She exhibits no edema.  Lymphadenopathy:    She has no cervical adenopathy.  Neurological: She is alert and oriented to person, place, and time. No cranial nerve deficit.  Skin: Skin is warm and dry. No erythema.  Psychiatric: Affect and judgment normal.   Breasts: breasts appears normal, right breast skin mild erythematous. no palpable masses, no skin or nipple changes or axillary nodes  Pathology 09/27/2017. SPECIMEN SUBMITTED:  A. Breast mass, right  B. Sentinel lymph node, right  C. Breast, right; lateral margins  DIAGNOSIS:  A. RIGHT BREAST MASS; NEEDLE LOCALIZED LUMPECTOMY:  - DUCTAL CARCINOMA IN SITU, NUCLEAR GRADE 3 WITH COMEDONECROSIS AND MICROCALCIFICATIONS.  - BIOPSY SITE CHANGES, MARKER CLIP PRESENT.  - THE  SURGICAL MARGINS ARE CLOSE BUT NEGATIVE (INFERIOR AND LATERAL  MARGINS <1 MM).  - SEE SUMMARY BELOW.   B. RIGHT SENTINEL LYMPH NODE; EXCISION:  - NO TUMOR SEEN IN TWO LYMPH NODES (0/2).   C. RIGHT BREAST, LATERAL MARGIN;  - NO TUMOR SEEN.  - FIBROCYSTIC CHANGE.  DUCTAL CARCINOMA IN SITU OF THE BREAST:  Procedure: Needle localized lumpectomy  Specimen Laterality: Right  Size (Extent) of DCIS: at least 36 mm  Histologic Type: Ductal carcinoma in situ (DCIS)  Nuclear Grade: 3  Necrosis: Comedo necrosis present  Margins: Close (<1 mm to inferior and lateral) but negative  Regional Lymph nodes: Uninvolved by tumor cells  Number of Lymph Nodes Examined: 2  Number of Sentinel Nodes Examined: 2  Pathologic Stage Classification (pTNM, AJCC 8th Edition): pTis pNx  TNM Descriptors: Not applicable   BREAST BIOMARKER TESTS (performed on a separate specimen):  Estrogen Receptor (ER) Status: POSITIVE, >90% nuclear staining    Average intensity of staining: Strong  Progesterone Receptor (PgR) Status: POSITIVE, 1% nuclear staining    Average intensity of staining: Strong   Assessment and plan- Patient is a 57 y.o. 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 Pathology staging pTis pN0cM0  Cancer Staging DCIS (ductal carcinoma in situ) Staging form: Breast, AJCC 8th Edition - Clinical: No stage assigned - Unsigned - Pathologic stage from 10/05/2017: Stage 0 (pTis (DCIS), pN0, cM0, G3, ER: Positive, PR: Positive, HER2: Not assessed ) - Signed by Earlie Server, MD on 10/05/2017     Patient's postmenopausal,  Plan letrozole 2.'5mg'$  daily for 5 years. Side effects of letrozole discussed with patient  # DEXA was done which showed osteopenia. Zometa was discussed and she declined.  # Advise not to use estrogen vaginal cream.   Earlie Server, MD, PhD Hematology Oncology [facility] Pager- [phone] 01/05/2018

## 2018-01-05 NOTE — Progress Notes (Signed)
No new changes noted today 

## 2018-01-20 ENCOUNTER — Ambulatory Visit: Payer: PRIVATE HEALTH INSURANCE | Admitting: Radiation Oncology

## 2018-02-02 ENCOUNTER — Ambulatory Visit: Payer: PRIVATE HEALTH INSURANCE | Admitting: Oncology

## 2018-02-02 ENCOUNTER — Other Ambulatory Visit: Payer: PRIVATE HEALTH INSURANCE

## 2018-02-03 ENCOUNTER — Other Ambulatory Visit: Payer: Self-pay

## 2018-02-03 ENCOUNTER — Encounter: Payer: Self-pay | Admitting: Radiation Oncology

## 2018-02-03 ENCOUNTER — Ambulatory Visit
Admission: RE | Admit: 2018-02-03 | Discharge: 2018-02-03 | Disposition: A | Discharge status: Home / Self Care | Payer: PRIVATE HEALTH INSURANCE | Source: Ambulatory Visit | Attending: Radiation Oncology | Admitting: Radiation Oncology

## 2018-02-03 VITALS — BP 122/66 | Temp 96.6°F | Resp 18 | Wt 174.4 lb

## 2018-02-03 DIAGNOSIS — Z170 Estrogen receptor positive status [ER+]: Secondary | ICD-10-CM | POA: Diagnosis not present

## 2018-02-03 DIAGNOSIS — D0511 Intraductal carcinoma in situ of right breast: Secondary | ICD-10-CM | POA: Insufficient documentation

## 2018-02-03 DIAGNOSIS — Z923 Personal history of irradiation: Secondary | ICD-10-CM | POA: Diagnosis not present

## 2018-02-03 NOTE — Progress Notes (Signed)
Radiation Oncology Follow up Note  Name: Lacey Garcia   Date:   02/03/2018 MRN:  [id number] DOB: January 28, 1961    This 57 y.o. female presents to the clinic today for one-month follow-up status post whole breast radiation to her right breast for ER/PR positive ductal carcinoma in situ.  REFERRING PROVIDER: Idelle Crouch, MD  HPI: Lacey Garcia is a 57 year old female now seen out 1 month having completed whole breast radiation to her right breast for ER/PR positive ductal carcinoma in situ. Seen today in routine follow-up she is doing well. She specifically denies breast tenderness cough or bone pain..she's been started on letrozole tolerating that well without side effect.  COMPLICATIONS OF TREATMENT: none  FOLLOW UP COMPLIANCE: keeps appointments   PHYSICAL EXAM:  BP 122/66   Temp (!) 96.6 F (35.9 C)   Resp 18   Wt 174 lb 6.1 oz (79.1 kg)   BMI 28.36 kg/m  Lungs are clear to A&P cardiac examination essentially unremarkable with regular rate and rhythm. No dominant mass or nodularity is noted in either breast in 2 positions examined. Incision is well-healed. No axillary or supraclavicular adenopathy is appreciated. Cosmetic result is excellent. Well-developed well-nourished patient in NAD. HEENT reveals PERLA, EOMI, discs not visualized.  Oral cavity is clear. No oral mucosal lesions are identified. Neck is clear without evidence of cervical or supraclavicular adenopathy. Lungs are clear to A&P. Cardiac examination is essentially unremarkable with regular rate and rhythm without murmur rub or thrill. Abdomen is benign with no organomegaly or masses noted. Motor sensory and DTR levels are equal and symmetric in the upper and lower extremities. Cranial nerves II through XII are grossly intact. Proprioception is intact. No peripheral adenopathy or edema is identified. No motor or sensory levels are noted. Crude visual fields are within normal range.  RADIOLOGY RESULTS: no current films for  review  PLAN: at the present time patient is doing well with no evidence of disease recovering nicely after whole breast radiation. I'm please were overall progress. She's been started on letrozole tolerating that well without side effect. I've asked to see her back in 4-5 months for follow-up. Patient is to call sooner with any concerns.  I would like to take this opportunity to thank you for allowing me to participate in the care of your patient.Noreene Filbert, MD

## 2018-02-09 ENCOUNTER — Other Ambulatory Visit: Payer: Self-pay | Admitting: *Deleted

## 2018-02-09 DIAGNOSIS — D0510 Intraductal carcinoma in situ of unspecified breast: Secondary | ICD-10-CM

## 2018-02-10 ENCOUNTER — Inpatient Hospital Stay: Payer: PRIVATE HEALTH INSURANCE | Attending: Oncology

## 2018-02-10 ENCOUNTER — Encounter: Payer: Self-pay | Admitting: Oncology

## 2018-02-10 ENCOUNTER — Inpatient Hospital Stay (HOSPITAL_BASED_OUTPATIENT_CLINIC_OR_DEPARTMENT_OTHER): Payer: PRIVATE HEALTH INSURANCE | Admitting: Oncology

## 2018-02-10 ENCOUNTER — Other Ambulatory Visit: Payer: Self-pay

## 2018-02-10 VITALS — BP 112/68 | HR 83 | Temp 97.0°F | Resp 18 | Wt 172.0 lb

## 2018-02-10 DIAGNOSIS — Z9011 Acquired absence of right breast and nipple: Secondary | ICD-10-CM | POA: Diagnosis not present

## 2018-02-10 DIAGNOSIS — Z85820 Personal history of malignant melanoma of skin: Secondary | ICD-10-CM

## 2018-02-10 DIAGNOSIS — G4700 Insomnia, unspecified: Secondary | ICD-10-CM | POA: Diagnosis not present

## 2018-02-10 DIAGNOSIS — D0511 Intraductal carcinoma in situ of right breast: Secondary | ICD-10-CM

## 2018-02-10 DIAGNOSIS — M549 Dorsalgia, unspecified: Secondary | ICD-10-CM | POA: Insufficient documentation

## 2018-02-10 DIAGNOSIS — D0510 Intraductal carcinoma in situ of unspecified breast: Secondary | ICD-10-CM

## 2018-02-10 DIAGNOSIS — R232 Flushing: Secondary | ICD-10-CM | POA: Insufficient documentation

## 2018-02-10 DIAGNOSIS — Z79899 Other long term (current) drug therapy: Secondary | ICD-10-CM | POA: Diagnosis not present

## 2018-02-10 DIAGNOSIS — K219 Gastro-esophageal reflux disease without esophagitis: Secondary | ICD-10-CM | POA: Diagnosis not present

## 2018-02-10 DIAGNOSIS — M8580 Other specified disorders of bone density and structure, unspecified site: Secondary | ICD-10-CM

## 2018-02-10 DIAGNOSIS — Z79811 Long term (current) use of aromatase inhibitors: Secondary | ICD-10-CM | POA: Insufficient documentation

## 2018-02-10 DIAGNOSIS — Z170 Estrogen receptor positive status [ER+]: Secondary | ICD-10-CM

## 2018-02-10 LAB — CBC WITH DIFFERENTIAL/PLATELET
BASOS ABS: 0 10*3/uL (ref 0–0.1)
BASOS PCT: 1 %
Eosinophils Absolute: 0.1 10*3/uL (ref 0–0.7)
Eosinophils Relative: 2 %
HEMATOCRIT: 35.2 % (ref 35.0–47.0)
Hemoglobin: 12 g/dL (ref 12.0–16.0)
Lymphocytes Relative: 27 %
Lymphs Abs: 1.4 10*3/uL (ref 1.0–3.6)
MCH: 31.6 pg (ref 26.0–34.0)
MCHC: 34.1 g/dL (ref 32.0–36.0)
MCV: 92.6 fL (ref 80.0–100.0)
MONO ABS: 0.5 10*3/uL (ref 0.2–0.9)
Monocytes Relative: 9 %
NEUTROS ABS: 3.1 10*3/uL (ref 1.4–6.5)
Neutrophils Relative %: 61 %
PLATELETS: 232 10*3/uL (ref 150–440)
RBC: 3.8 MIL/uL (ref 3.80–5.20)
RDW: 13.9 % (ref 11.5–14.5)
WBC: 5.1 10*3/uL (ref 3.6–11.0)

## 2018-02-10 LAB — COMPREHENSIVE METABOLIC PANEL
ALBUMIN: 4.1 g/dL (ref 3.5–5.0)
ALK PHOS: 88 U/L (ref 38–126)
ALT: 14 U/L (ref 14–54)
ANION GAP: 10 (ref 5–15)
AST: 14 U/L — ABNORMAL LOW (ref 15–41)
BUN: 16 mg/dL (ref 6–20)
CALCIUM: 9.1 mg/dL (ref 8.9–10.3)
CO2: 26 mmol/L (ref 22–32)
Chloride: 102 mmol/L (ref 101–111)
Creatinine, Ser: 0.59 mg/dL (ref 0.44–1.00)
GFR calc non Af Amer: >60 mL/min (ref >60)
GLUCOSE: 108 mg/dL — AB (ref 65–99)
Potassium: 3.9 mmol/L (ref 3.5–5.1)
Sodium: 138 mmol/L (ref 135–145)
TOTAL PROTEIN: 7.3 g/dL (ref 6.5–8.1)
Total Bilirubin: 0.5 mg/dL (ref 0.3–1.2)

## 2018-02-10 NOTE — Progress Notes (Signed)
Here for follow up   Feels like she is "doing well " overall

## 2018-02-11 NOTE — Progress Notes (Signed)
Hematology/Oncology Consult note [facility] [phone] [phone]  Patient Care Team: Idelle Crouch, MD as PCP - General (Internal Medicine)   Name of the patient: Lacey Garcia  [id number]  21-Jul-1961   REASON FOR COSULTATION:  Right breast DCIS History of presenting illness-  This is a 57 year old female previously healthy who presented to clinic for initial visit for evaluation of recently diagnosed right breast DCIS. Patient had recent bilateral mammogram done on 08/04/2017 which demonstrated calcifications in the right breast. She had additional film on 08/19/2017 which demonstrated calcification in the posterior aspect of the upper outer quadrant of the right breast. Calcifications spanning an area of 8 mm and are associated with an asymmetric density. Patient had a steroid tactic biopsy on 09/01/2017. Pathology reveals high-grade DCIS, with comedonecrosis and calcifications. ER positive > 90%, PR positive>1%.  Patient denies palpating any breast mass or having any nipple discharge. She denies any breast tenderness or skin changes. She has a remote history of 2 episodes of early stage melanoma. First episode was back in 1993 and she received resection. Since then she has dermatology surveillance routinely and found out another early-stage melanoma which was resected again. She does any family history of cancer. She has been on OCP for many years until 3 years ago, she was taken off OCP and her menstrual period stopped 3 years ago. She denies any use of estrogen replacement. 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 Reports chronic back pain. Denies any cough, chest pain, abdominal pain.   # Osteopenia #  Postmenopausal, LMP 3 years ago.    Cancer Treatment # 09/27/2017  lumpectomy. Inferior and lateral margin were close at less than 1 mm. 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 Consideration for reexcision of the  inferior margin was discussed however after discussion the patient preferred not to have any more surgery. #  s/p adjuvant radiation. #  01/06/2018 started on Letrozole 2.'5mg'$  daily #  Zometa was discussed for osteoporosis prevention while on AI. She declined.   INTERVAL HISTORY Patient present for follow-up and discussion about treatment for DCIS. Has been on Letrozole for about one months. Tolerates well. Hot flush is manageable. Have some problem with sleep. Wakes up in the middle of night but able to go back to sleep.  Denies joint pain.   Review of systems- Review of Systems  Constitutional: Negative for chills, diaphoresis, fever, malaise/fatigue and weight loss.  HENT: Negative for ear pain, hearing loss, nosebleeds and tinnitus.   Eyes: Negative for photophobia and pain.  Respiratory: Negative for cough, hemoptysis and sputum production.   Cardiovascular: Negative for palpitations, orthopnea and claudication.  Gastrointestinal: Negative for constipation, heartburn, nausea and vomiting.  Genitourinary: Negative for dysuria, hematuria and urgency.  Musculoskeletal: Negative for back pain, joint pain and myalgias.  Neurological: Negative for dizziness, tingling, sensory change and headaches.  Endo/Heme/Allergies: Does not bruise/bleed easily.  Psychiatric/Behavioral: Negative for depression. The patient has insomnia. The patient is not nervous/anxious.    Allergies  Allergen Reactions  . Sulfa Antibiotics Hives and Rash     Past Medical History:  Diagnosis Date  . Breast cancer ([place]) 08/2017   right breast  . Cancer ([place])    melanoma  . Complication of anesthesia   . GERD (gastroesophageal reflux disease)    rare-no meds  . History of kidney stones 1991  . Melanoma ([place]) 1993   Melanoma x 2 times.   Marland Kitchen  PONV (postoperative nausea and vomiting)     Past Surgical History:  Procedure Laterality Date  . BREAST BIOPSY Right 09/01/2017   AFFIRM Stereo/intraductal carcinoma in  situ  . BREAST LUMPECTOMY Right 09/27/2017   intraductal cacinoma in situ  . coloscopy  2014  . FRACTURE SURGERY    . HAND SURGERY     trigger finger release  . leg left rod and pins    . LITHOTRIPSY    . MELANOMA EXCISION     left leg and right leg  . PARTIAL MASTECTOMY WITH NEEDLE LOCALIZATION Right 09/27/2017   Procedure: PARTIAL MASTECTOMY WITH NEEDLE LOCALIZATION;  Surgeon: Leonie Green, MD;  Location: [facility] ORS;  Service: General;  Laterality: Right;  . SENTINEL NODE BIOPSY Right 09/27/2017   Procedure: SENTINEL NODE BIOPSY;  Surgeon: Leonie Green, MD;  Location: [facility] ORS;  Service: General;  Laterality: Right;    Social History   Socioeconomic History  . Marital status: Married    Spouse name: Not on file  . Number of children: Not on file  . Years of education: Not on file  . Highest education level: Not on file  Occupational History  . Not on file  Social Needs  . Financial resource strain: Not on file  . Food insecurity:    Worry: Not on file    Inability: Not on file  . Transportation needs:    Medical: Not on file    Non-medical: Not on file  Tobacco Use  . Smoking status: Never Smoker  . Smokeless tobacco: Never Used  . Tobacco comment: no tobacco use  Substance and Sexual Activity  . Alcohol use: No    Frequency: Never  . Drug use: No  . Sexual activity: Yes  Lifestyle  . Physical activity:    Days per week: Not on file    Minutes per session: Not on file  . Stress: Not on file  Relationships  . Social connections:    Talks on phone: Not on file    Gets together: Not on file    Attends religious service: Not on file    Active member of club or organization: Not on file    Attends meetings of clubs or organizations: Not on file    Relationship status: Not on file  . Intimate partner violence:    Fear of current or ex partner: Not on file    Emotionally abused: Not on file    Physically abused: Not on file    Forced sexual  activity: Not on file  Other Topics Concern  . Not on file  Social History Narrative  . Not on file     Family History  Problem Relation Age of Onset  . Hypertension Mother   . Heart disease Mother   . Diabetes Father   . Hypertension Father   . Heart disease Father   . Breast cancer Neg Hx      Current Outpatient Medications:  .  Cholecalciferol (VITAMIN D-3) 5000 units TABS, Take 1 tablet daily by mouth., Disp: , Rfl:  .  fluticasone (FLONASE) 50 MCG/ACT nasal spray, Place 1 spray into both nostrils daily., Disp: , Rfl:  .  letrozole (FEMARA) 2.5 MG tablet, Take 1 tablet (2.5 mg total) by mouth daily., Disp: 30 tablet, Rfl: 3 .  Omega-3 Fatty Acids (OMEGA-3 FISH OIL PO), Take 650 mg daily by mouth. PT ALREADY STOPPED FISH OIL FOR UPCOMING SURGERY, Disp: , Rfl:  .  ibuprofen (ADVIL,MOTRIN) 200  MG tablet, Take 200 mg by mouth every 6 (six) hours as needed., Disp: , Rfl:  .  lansoprazole (PREVACID) 30 MG capsule, Take 30 mg by mouth daily at 12 noon., Disp: , Rfl:    Physical exam:  Vitals:   02/10/18 1447  BP: 112/68  Pulse: 83  Resp: 18  Temp: (!) 97 F (36.1 C)  TempSrc: Tympanic  Weight: 172 lb (78 kg)   Physical Exam  Constitutional: She is oriented to person, place, and time and well-developed, well-nourished, and in no distress. No distress.  HENT:  Head: Atraumatic.  Mouth/Throat: Oropharynx is clear and moist. No oropharyngeal exudate.  Eyes: EOM are normal. No scleral icterus.  Cardiovascular: Normal rate and regular rhythm.  No murmur heard. Pulmonary/Chest: Effort normal and breath sounds normal. No respiratory distress. She has no wheezes.  Abdominal: Soft. Bowel sounds are normal. She exhibits no distension. There is no rebound and no guarding.  Musculoskeletal: Normal range of motion. She exhibits no edema.  Lymphadenopathy:    She has no cervical adenopathy.  Neurological: She is alert and oriented to person, place, and time. No cranial nerve deficit.   Skin: Skin is warm and dry. No erythema.  Psychiatric: Affect and judgment normal.    Pathology 09/27/2017. SPECIMEN SUBMITTED:  A. Breast mass, right  B. Sentinel lymph node, right  C. Breast, right; lateral margins  DIAGNOSIS:  A. RIGHT BREAST MASS; NEEDLE LOCALIZED LUMPECTOMY:  - DUCTAL CARCINOMA IN SITU, NUCLEAR GRADE 3 WITH COMEDONECROSIS AND MICROCALCIFICATIONS.  - BIOPSY SITE CHANGES, MARKER CLIP PRESENT.  - THE SURGICAL MARGINS ARE CLOSE BUT NEGATIVE (INFERIOR AND LATERAL MARGINS <1 MM).  - SEE SUMMARY BELOW.   B. RIGHT SENTINEL LYMPH NODE; EXCISION:  - NO TUMOR SEEN IN TWO LYMPH NODES (0/2).   C. RIGHT BREAST, LATERAL MARGIN;  - NO TUMOR SEEN.  - FIBROCYSTIC CHANGE.  DUCTAL CARCINOMA IN SITU OF THE BREAST:  Procedure: Needle localized lumpectomy  Specimen Laterality: Right  Size (Extent) of DCIS: at least 36 mm  Histologic Type: Ductal carcinoma in situ (DCIS)  Nuclear Grade: 3  Necrosis: Comedo necrosis present  Margins: Close (<1 mm to inferior and lateral) but negative  Regional Lymph nodes: Uninvolved by tumor cells  Number of Lymph Nodes Examined: 2  Number of Sentinel Nodes Examined: 2  Pathologic Stage Classification (pTNM, AJCC 8th Edition): pTis pNx  TNM Descriptors: Not applicable   BREAST BIOMARKER TESTS (performed on a separate specimen):  Estrogen Receptor (ER) Status: POSITIVE, >90% nuclear staining    Average intensity of staining: Strong  Progesterone Receptor (PgR) Status: POSITIVE, 1% nuclear staining    Average intensity of staining: Strong   Assessment and plan- Patient is a 57 y.o. 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 Pathology staging pTis pN0cM0  Cancer Staging DCIS (ductal carcinoma in situ) Staging form: Breast, AJCC 8th Edition - Clinical: No stage assigned - Unsigned - Pathologic stage from 10/05/2017:  Stage 0 (pTis (DCIS), pN0, cM0, G3, ER: Positive, PR: Positive, HER2: Not assessed ) - Signed by Earlie Server, MD on 10/05/2017  1. Ductal carcinoma in situ (DCIS) of right breast   2. Osteopenia, unspecified location   3. Insomnia, unspecified type    Tolerates letrozole 2.'5mg'$  daily, continue for 5 years. # Advise not to use estrogen vaginal cream and she has stopped.  # Insomnia: advise participating exercise program and also can try  OTC melatonin.  # Hot flush: patient feels it is manageable. She is aware effexor can be helpful and she declined.   Earlie Server, MD, PhD Hematology Oncology [facility] Pager- [phone]

## 2018-03-30 ENCOUNTER — Other Ambulatory Visit: Payer: Self-pay | Admitting: Oncology

## 2018-06-20 ENCOUNTER — Other Ambulatory Visit: Payer: Self-pay | Admitting: General Surgery

## 2018-06-20 DIAGNOSIS — Z853 Personal history of malignant neoplasm of breast: Secondary | ICD-10-CM

## 2018-07-07 ENCOUNTER — Ambulatory Visit: Payer: PRIVATE HEALTH INSURANCE | Admitting: Oncology

## 2018-07-07 ENCOUNTER — Inpatient Hospital Stay: Payer: PRIVATE HEALTH INSURANCE | Attending: Oncology

## 2018-07-07 ENCOUNTER — Other Ambulatory Visit: Payer: Self-pay

## 2018-07-07 ENCOUNTER — Encounter: Payer: Self-pay | Admitting: Oncology

## 2018-07-07 ENCOUNTER — Ambulatory Visit
Admission: RE | Admit: 2018-07-07 | Discharge: 2018-07-07 | Disposition: A | Discharge status: Home / Self Care | Payer: PRIVATE HEALTH INSURANCE | Source: Ambulatory Visit | Attending: Radiation Oncology | Admitting: Radiation Oncology

## 2018-07-07 ENCOUNTER — Other Ambulatory Visit: Payer: PRIVATE HEALTH INSURANCE

## 2018-07-07 ENCOUNTER — Encounter: Payer: Self-pay | Admitting: Radiation Oncology

## 2018-07-07 ENCOUNTER — Inpatient Hospital Stay (HOSPITAL_BASED_OUTPATIENT_CLINIC_OR_DEPARTMENT_OTHER): Payer: PRIVATE HEALTH INSURANCE | Admitting: Oncology

## 2018-07-07 VITALS — BP 113/73 | HR 72 | Temp 97.8°F | Resp 12 | Ht 67.0 in | Wt 173.0 lb

## 2018-07-07 VITALS — BP 126/74 | HR 72 | Temp 97.6°F | Resp 18 | Wt 173.1 lb

## 2018-07-07 DIAGNOSIS — M549 Dorsalgia, unspecified: Secondary | ICD-10-CM | POA: Insufficient documentation

## 2018-07-07 DIAGNOSIS — K219 Gastro-esophageal reflux disease without esophagitis: Secondary | ICD-10-CM | POA: Diagnosis not present

## 2018-07-07 DIAGNOSIS — Z9011 Acquired absence of right breast and nipple: Secondary | ICD-10-CM | POA: Diagnosis not present

## 2018-07-07 DIAGNOSIS — Z79811 Long term (current) use of aromatase inhibitors: Secondary | ICD-10-CM | POA: Insufficient documentation

## 2018-07-07 DIAGNOSIS — Z85820 Personal history of malignant melanoma of skin: Secondary | ICD-10-CM

## 2018-07-07 DIAGNOSIS — Z170 Estrogen receptor positive status [ER+]: Secondary | ICD-10-CM | POA: Insufficient documentation

## 2018-07-07 DIAGNOSIS — K30 Functional dyspepsia: Secondary | ICD-10-CM | POA: Diagnosis not present

## 2018-07-07 DIAGNOSIS — Z79899 Other long term (current) drug therapy: Secondary | ICD-10-CM | POA: Insufficient documentation

## 2018-07-07 DIAGNOSIS — D0511 Intraductal carcinoma in situ of right breast: Secondary | ICD-10-CM | POA: Insufficient documentation

## 2018-07-07 DIAGNOSIS — R232 Flushing: Secondary | ICD-10-CM | POA: Diagnosis not present

## 2018-07-07 DIAGNOSIS — Z87442 Personal history of urinary calculi: Secondary | ICD-10-CM | POA: Insufficient documentation

## 2018-07-07 DIAGNOSIS — M8580 Other specified disorders of bone density and structure, unspecified site: Secondary | ICD-10-CM | POA: Insufficient documentation

## 2018-07-07 DIAGNOSIS — Z923 Personal history of irradiation: Secondary | ICD-10-CM | POA: Diagnosis not present

## 2018-07-07 LAB — CBC WITH DIFFERENTIAL/PLATELET
BASOS PCT: 1 %
Basophils Absolute: 0 10*3/uL (ref 0–0.1)
Eosinophils Absolute: 0.1 10*3/uL (ref 0–0.7)
Eosinophils Relative: 1 %
HEMATOCRIT: 38.3 % (ref 35.0–47.0)
Hemoglobin: 13 g/dL (ref 12.0–16.0)
Lymphocytes Relative: 30 %
Lymphs Abs: 1.4 10*3/uL (ref 1.0–3.6)
MCH: 31.4 pg (ref 26.0–34.0)
MCHC: 33.9 g/dL (ref 32.0–36.0)
MCV: 92.5 fL (ref 80.0–100.0)
MONOS PCT: 8 %
Monocytes Absolute: 0.4 10*3/uL (ref 0.2–0.9)
NEUTROS ABS: 2.7 10*3/uL (ref 1.4–6.5)
Neutrophils Relative %: 60 %
Platelets: 240 10*3/uL (ref 150–440)
RBC: 4.14 MIL/uL (ref 3.80–5.20)
RDW: 14.1 % (ref 11.5–14.5)
WBC: 4.5 10*3/uL (ref 3.6–11.0)

## 2018-07-07 LAB — COMPREHENSIVE METABOLIC PANEL
ALBUMIN: 4.5 g/dL (ref 3.5–5.0)
ALK PHOS: 88 U/L (ref 38–126)
ALT: 20 U/L (ref 0–44)
ANION GAP: 8 (ref 5–15)
AST: 18 U/L (ref 15–41)
BILIRUBIN TOTAL: 1 mg/dL (ref 0.3–1.2)
BUN: 13 mg/dL (ref 6–20)
CALCIUM: 9.5 mg/dL (ref 8.9–10.3)
CO2: 28 mmol/L (ref 22–32)
CREATININE: 0.62 mg/dL (ref 0.44–1.00)
Chloride: 104 mmol/L (ref 98–111)
GFR calc Af Amer: >60 mL/min (ref >60)
GFR calc non Af Amer: >60 mL/min (ref >60)
GLUCOSE: 108 mg/dL — AB (ref 70–99)
Potassium: 3.9 mmol/L (ref 3.5–5.1)
SODIUM: 140 mmol/L (ref 135–145)
TOTAL PROTEIN: 7.7 g/dL (ref 6.5–8.1)

## 2018-07-07 MED ORDER — LETROZOLE 2.5 MG PO TABS: 2.5000 mg | ORAL_TABLET | Freq: Every day | ORAL | 5 refills | Status: DC

## 2018-07-07 NOTE — Progress Notes (Signed)
Radiation Oncology Follow up Note  Name: Lacey Garcia   Date:   07/07/2018 MRN:  [id number] DOB: 1961/06/14    This 57 y.o. female presents to the clinic today for six-month follow-up status post whole breast radiation to her right breast for ER/PR positive ductal carcinoma in situ.  REFERRING PROVIDER: Idelle Crouch, MD  HPI: patient is a 57 year old female now seen out 6 months having completed whole breast radiation to her right breast for ER/PR positive ductal carcinoma in situ. She is seen today in routine follow-up is doing well. She specifically denies breast tenderness cough or bone pain. She's currently on letrozole having some minor side effects such as GI upset hot flashes although otherwise tolerated fairly well..she is scheduled for mammograms next month.  COMPLICATIONS OF TREATMENT: none  FOLLOW UP COMPLIANCE: keeps appointments   PHYSICAL EXAM:  BP 113/73 (BP Location: Left Arm, Patient Position: Sitting)   Pulse 72   Temp 97.8 F (36.6 C) (Tympanic)   Resp 12   Ht 5\' 7"  (1.702 m)   Wt 173 lb (78.5 kg)   BMI 27.10 kg/m  Lungs are clear to A&P cardiac examination essentially unremarkable with regular rate and rhythm. No dominant mass or nodularity is noted in either breast in 2 positions examined. Incision is well-healed. No axillary or supraclavicular adenopathy is appreciated. Cosmetic result is excellent.Well-developed well-nourished patient in NAD. HEENT reveals PERLA, EOMI, discs not visualized.  Oral cavity is clear. No oral mucosal lesions are identified. Neck is clear without evidence of cervical or supraclavicular adenopathy. Lungs are clear to A&P. Cardiac examination is essentially unremarkable with regular rate and rhythm without murmur rub or thrill. Abdomen is benign with no organomegaly or masses noted. Motor sensory and DTR levels are equal and symmetric in the upper and lower extremities. Cranial nerves II through XII are grossly intact. Proprioception is  intact. No peripheral adenopathy or edema is identified. No motor or sensory levels are noted. Crude visual fields are within normal range.  RADIOLOGY RESULTS: mammograms will will be reviewed when available.  PLAN: present time patient is doing well with no evidence of disease now out 6 months from whole breast radiation. I am please were overall progress. She continues on letrozole. She may have discussed with medical oncology about her side effect profile. Otherwise I've asked to see her back in 6 months and then will start once your follow-up visits. Patient knows to call with any concerns at any time.  I would like to take this opportunity to thank you for allowing me to participate in the care of your patient.Noreene Filbert, MD

## 2018-07-07 NOTE — Progress Notes (Signed)
Hematology/Oncology follow up  [facility] [phone] Fax:([phone]  Patient Care Team: Idelle Crouch, MD as PCP - General (Internal Medicine)   Name of the patient: Lacey Garcia  [id number]  29-Jun-1961   REASON FOR VISIT Follow up for right breast DCIS History of Presenting Illness-  This is a 57 year old female previously healthy who presented to clinic for initial visit for evaluation of recently diagnosed right breast DCIS. Patient had recent bilateral mammogram done on 08/04/2017 which demonstrated calcifications in the right breast. She had additional film on 08/19/2017 which demonstrated calcification in the posterior aspect of the upper outer quadrant of the right breast. Calcifications spanning an area of 8 mm and are associated with an asymmetric density. Patient had a steroid tactic biopsy on 09/01/2017. Pathology reveals high-grade DCIS, with comedonecrosis and calcifications. ER positive > 90%, PR positive>1%.  Patient denies palpating any breast mass or having any nipple discharge. She denies any breast tenderness or skin changes. She has a remote history of 2 episodes of early stage melanoma. First episode was back in 1993 and she received resection. Since then she has dermatology surveillance routinely and found out another early-stage melanoma which was resected again. She does any family history of cancer. She has been on OCP for many years until 3 years ago, she was taken off OCP and her menstrual period stopped 3 years ago. She denies any use of estrogen replacement. 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 Reports chronic back pain. Denies any cough, chest pain, abdominal pain.   # Osteopenia #  Postmenopausal, LMP 3 years ago.    Cancer Treatment # 09/27/2017  lumpectomy. Inferior and lateral margin were close at less than 1 mm. 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 Consideration for reexcision of the  inferior margin was discussed however after discussion the patient preferred not to have any more surgery. #  s/p adjuvant radiation. #  01/06/2018 started on Letrozole 2.69m daily #  Zometa was discussed for osteoporosis prevention while on AI. She declined.   INTERVAL HISTORY Patient present for follow-up of DCIS treatments.  Patient takes letrozole 2.5 mg daily since March 2019. Reports having loose stools for a while and now bowel movements get better. Manageable hot flushes.  Denies any joint pain,back pain, new lumps or bumps  Review of systems- Review of Systems  Constitutional: Negative for chills, diaphoresis, fever, malaise/fatigue and weight loss.  HENT: Negative for ear pain, hearing loss, nosebleeds, sore throat and tinnitus.   Eyes: Negative for double vision, photophobia, pain and redness.  Respiratory: Negative for cough, hemoptysis, sputum production, shortness of breath and wheezing.   Cardiovascular: Negative for chest pain, palpitations, orthopnea and claudication.  Gastrointestinal: Negative for abdominal pain, blood in stool, constipation, heartburn, nausea and vomiting.  Genitourinary: Negative for dysuria, hematuria and urgency.  Musculoskeletal: Negative for back pain, joint pain, myalgias and neck pain.  Skin: Negative for itching and rash.  Neurological: Negative for dizziness, tingling, tremors, sensory change and headaches.  Endo/Heme/Allergies: Negative for environmental allergies. Does not bruise/bleed easily.  Psychiatric/Behavioral: Negative for depression. The patient is not nervous/anxious and does not have insomnia.    Allergies  Allergen Reactions  . Sulfa Antibiotics Hives and Rash     Past Medical History:  Diagnosis Date  . [place]   [place]  . [place]anoma  . Complication of anesthesia   . GERD (  gastroesophageal reflux disease)    rare-no meds  . History of kidney stones 1991  . Melanoma ([place]) 1993    Melanoma x 2 times.   Marland Kitchen PONV (postoperative nausea and vomiting)     Past Surgical History:  Procedure Laterality Date  . BREAST BIOPSY Right 09/01/2017   AFFIRM Stereo/intraductal carcinoma in situ  . BREAST LUMPECTOMY Right 09/27/2017   intraductal cacinoma in situ  . coloscopy  2014  . FRACTURE SURGERY    . HAND SURGERY     trigger finger release  . leg left rod and pins    . LITHOTRIPSY    . MELANOMA EXCISION     left leg and right leg  . PARTIAL MASTECTOMY WITH NEEDLE LOCALIZATION Right 09/27/2017   Procedure: PARTIAL MASTECTOMY WITH NEEDLE LOCALIZATION;  Surgeon: Leonie Green, MD;  Location: [facility] ORS;  Service: General;  Laterality: Right;  . SENTINEL NODE BIOPSY Right 09/27/2017   Procedure: SENTINEL NODE BIOPSY;  Surgeon: Leonie Green, MD;  Location: [facility] ORS;  Service: General;  Laterality: Right;    Social History   Socioeconomic History  . Marital status: Married    Spouse name: Not on file  . Number of children: Not on file  . Years of education: Not on file  . Highest education level: Not on file  Occupational History  . Not on file  Social Needs  . Financial resource strain: Not on file  . Food insecurity:    Worry: Not on file    Inability: Not on file  . Transportation needs:    Medical: Not on file    Non-medical: Not on file  Tobacco Use  . Smoking status: Never Smoker  . Smokeless tobacco: Never Used  . Tobacco comment: no tobacco use  Substance and Sexual Activity  . Alcohol use: No    Frequency: Never  . Drug use: No  . Sexual activity: Yes  Lifestyle  . Physical activity:    Days per week: Not on file    Minutes per session: Not on file  . Stress: Not on file  Relationships  . Social connections:    Talks on phone: Not on file    Gets together: Not on file    Attends religious service: Not on file    Active member of club or organization: Not on file    Attends meetings of clubs or organizations: Not on file     Relationship status: Not on file  . Intimate partner violence:    Fear of current or ex partner: Not on file    Emotionally abused: Not on file    Physically abused: Not on file    Forced sexual activity: Not on file  Other Topics Concern  . Not on file  Social History Narrative  . Not on file     Family History  Problem Relation Age of Onset  . Hypertension Mother   . Heart disease Mother   . Diabetes Father   . Hypertension Father   . Heart disease Father   . Breast cancer Neg Hx      Current Outpatient Medications:  .  Cholecalciferol (VITAMIN D-3) 5000 units TABS, Take 1 tablet daily by mouth., Disp: , Rfl:  .  Diclofenac Sodium 3 % GEL, Apply 1 application topically 2 (two) times daily., Disp: , Rfl:  .  ibuprofen (ADVIL,MOTRIN) 200 MG tablet, Take 200 mg by mouth every 6 (six) hours as needed., Disp: , Rfl:  .  letrozole (FEMARA) 2.5 MG tablet, TAKE 1 TABLET BY MOUTH EVERY DAY, Disp: 30 tablet, Rfl: 3 .  Melatonin 10-10 MG TBCR, Take 1 capsule by mouth every evening., Disp: , Rfl:    Physical exam:  Vitals:   07/07/18 0942 07/07/18 0946  BP: 126/74   Pulse: 72   Resp: 18   Temp: 97.6 F (36.4 C)   TempSrc: Tympanic   Weight: 171 lb 15.3 oz (78 kg) 173 lb 1 oz (78.5 kg)   Physical Exam  Constitutional: She is oriented to person, place, and time and well-developed, well-nourished, and in no distress. No distress.  HENT:  Head: Normocephalic and atraumatic.  Nose: Nose normal.  Mouth/Throat: Oropharynx is clear and moist. No oropharyngeal exudate.  Eyes: Pupils are equal, round, and reactive to light. EOM are normal. Left eye exhibits no discharge. No scleral icterus.  Neck: Normal range of motion. Neck supple.  Cardiovascular: Normal rate and regular rhythm.  No murmur heard. Pulmonary/Chest: Effort normal and breath sounds normal. No respiratory distress. She has no wheezes. She has no rales. She exhibits no tenderness.  Abdominal: Soft. Bowel sounds are  normal. She exhibits no distension and no mass. There is no tenderness. There is no rebound and no guarding.  Musculoskeletal: Normal range of motion. She exhibits no edema.  Lymphadenopathy:    She has no cervical adenopathy.  Neurological: She is alert and oriented to person, place, and time. No cranial nerve deficit. She exhibits normal muscle tone. Coordination normal.  Skin: Skin is warm and dry. She is not diaphoretic. No erythema.  Psychiatric: Affect and judgment normal.    Pathology 09/27/2017. SPECIMEN SUBMITTED:  A. Breast mass, right  B. Sentinel lymph node, right  C. Breast, right; lateral margins  DIAGNOSIS:  A. RIGHT BREAST MASS; NEEDLE LOCALIZED LUMPECTOMY:  - DUCTAL CARCINOMA IN SITU, NUCLEAR GRADE 3 WITH COMEDONECROSIS AND MICROCALCIFICATIONS.  - BIOPSY SITE CHANGES, MARKER CLIP PRESENT.  - THE SURGICAL MARGINS ARE CLOSE BUT NEGATIVE (INFERIOR AND LATERAL MARGINS <1 MM).  - SEE SUMMARY BELOW.   B. RIGHT SENTINEL LYMPH NODE; EXCISION:  - NO TUMOR SEEN IN TWO LYMPH NODES (0/2).   C. RIGHT BREAST, LATERAL MARGIN;  - NO TUMOR SEEN.  - FIBROCYSTIC CHANGE.  DUCTAL CARCINOMA IN SITU OF THE BREAST:  Procedure: Needle localized lumpectomy  Specimen Laterality: Right  Size (Extent) of DCIS: at least 36 mm  Histologic Type: Ductal carcinoma in situ (DCIS)  Nuclear Grade: 3  Necrosis: Comedo necrosis present  Margins: Close (<1 mm to inferior and lateral) but negative  Regional Lymph nodes: Uninvolved by tumor cells  Number of Lymph Nodes Examined: 2  Number of Sentinel Nodes Examined: 2  Pathologic Stage Classification (pTNM, AJCC 8th Edition): pTis pNx  TNM Descriptors: Not applicable   BREAST BIOMARKER TESTS (performed on a separate specimen):  Estrogen Receptor (ER) Status: POSITIVE, >90% nuclear staining    Average intensity of staining: Strong  Progesterone Receptor (PgR) Status: POSITIVE, 1% nuclear staining    Average intensity of staining:  Strong   Assessment and plan- Patient is a 57 y.o. 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 Pathology staging pTis pN0cM0  Cancer Staging DCIS (ductal carcinoma in situ) Staging form: Breast, AJCC 8th Edition - Clinical: No stage assigned - Unsigned - Pathologic stage from 10/05/2017: Stage 0 (pTis (DCIS), pN0, cM0, G3, ER: Positive, PR: Positive, HER2: Not assessed ) -  Signed by Earlie Server, MD on 10/05/2017  1. Ductal carcinoma in situ (DCIS) of right breast   2. Osteopenia, unspecified location    Tolerates letrozole 2.42m daily. Continue for 5 years. Refill Rx given.  Labs are reviewed and discussed with patient.  Osteopenia, advise patient to continue taking OTC calcium and vitamin D supplements. She previously declined bisphosphonate treatments.  She needs annual mammogram which is due in November 2019. Per patient she has scheduled already.   Total face to face encounter time for this patient visit was 15 min. >50% of the time was  spent in counseling and coordination of care.   ZEarlie Server MD, PhD Hematology Oncology [facility]at [facility] CenterPager- [phone]

## 2018-07-07 NOTE — Progress Notes (Signed)
Patient here for follow up. No concerns voiced.  °

## 2018-08-07 ENCOUNTER — Other Ambulatory Visit: Payer: PRIVATE HEALTH INSURANCE

## 2018-08-18 ENCOUNTER — Ambulatory Visit
Admission: RE | Admit: 2018-08-18 | Discharge: 2018-08-18 | Disposition: A | Discharge status: Home / Self Care | Payer: PRIVATE HEALTH INSURANCE | Source: Ambulatory Visit | Attending: General Surgery | Admitting: General Surgery

## 2018-08-18 DIAGNOSIS — Z853 Personal history of malignant neoplasm of breast: Secondary | ICD-10-CM

## 2018-09-08 ENCOUNTER — Ambulatory Visit: Payer: PRIVATE HEALTH INSURANCE | Admitting: Oncology

## 2018-09-08 ENCOUNTER — Other Ambulatory Visit: Payer: PRIVATE HEALTH INSURANCE

## 2018-12-01 ENCOUNTER — Encounter: Payer: Self-pay | Admitting: *Deleted

## 2019-01-12 ENCOUNTER — Other Ambulatory Visit: Payer: PRIVATE HEALTH INSURANCE

## 2019-01-12 ENCOUNTER — Ambulatory Visit: Payer: PRIVATE HEALTH INSURANCE | Admitting: Oncology

## 2019-01-12 ENCOUNTER — Ambulatory Visit: Payer: PRIVATE HEALTH INSURANCE | Admitting: Radiation Oncology

## 2019-01-16 ENCOUNTER — Other Ambulatory Visit: Payer: Self-pay | Admitting: *Deleted

## 2019-01-16 MED ORDER — LETROZOLE 2.5 MG PO TABS: 2.5000 mg | ORAL_TABLET | Freq: Every day | ORAL | 0 refills | Status: DC

## 2019-01-19 ENCOUNTER — Other Ambulatory Visit: Payer: Self-pay

## 2019-01-19 ENCOUNTER — Ambulatory Visit
Admission: RE | Admit: 2019-01-19 | Discharge: 2019-01-19 | Disposition: A | Discharge status: Home / Self Care | Payer: PRIVATE HEALTH INSURANCE | Source: Ambulatory Visit | Attending: Radiation Oncology | Admitting: Radiation Oncology

## 2019-01-19 ENCOUNTER — Encounter: Payer: Self-pay | Admitting: Radiation Oncology

## 2019-01-19 VITALS — BP 127/70 | HR 78 | Temp 98.0°F | Resp 18 | Wt 178.5 lb

## 2019-01-19 DIAGNOSIS — Z923 Personal history of irradiation: Secondary | ICD-10-CM | POA: Diagnosis not present

## 2019-01-19 DIAGNOSIS — Z170 Estrogen receptor positive status [ER+]: Secondary | ICD-10-CM | POA: Insufficient documentation

## 2019-01-19 DIAGNOSIS — Z79811 Long term (current) use of aromatase inhibitors: Secondary | ICD-10-CM | POA: Diagnosis not present

## 2019-01-19 DIAGNOSIS — D0511 Intraductal carcinoma in situ of right breast: Secondary | ICD-10-CM | POA: Insufficient documentation

## 2019-01-19 NOTE — Progress Notes (Signed)
Radiation Oncology Follow up Note  Name: Lacey Garcia   Date:   01/19/2019 MRN:  [id number] DOB: 1961/08/24    This 58 y.o. female presents to the clinic today for 1 year follow-up status post whole breast radiation to her right breast for ER/PR positive ductal carcinoma in situ.  REFERRING PROVIDER: Idelle Crouch, MD  HPI: patient is a 58 year old female now out 1 year having completed whole breast radiation to her right breast for ER/PR positive ductal carcinoma in situ seen today in routine follow-up she is doing well. She specifically denies breast tenderness cough or bone pain. Patient is currently on letrozole tolerating that well without side effect..patient had mammograms back in October which I have reviewed were BI-RADS 2 benign.  COMPLICATIONS OF TREATMENT: none  FOLLOW UP COMPLIANCE: keeps appointments   PHYSICAL EXAM:  BP 127/70   Pulse 78   Temp 98 F (36.7 C)   Resp 18   Wt 178 lb 7.4 oz (81 kg)   BMI 27.95 kg/m  Lungs are clear to A&P cardiac examination essentially unremarkable with regular rate and rhythm. No dominant mass or nodularity is noted in either breast in 2 positions examined. Incision is well-healed. No axillary or supraclavicular adenopathy is appreciated. Cosmetic result is excellent.Well-developed well-nourished patient in NAD. HEENT reveals PERLA, EOMI, discs not visualized.  Oral cavity is clear. No oral mucosal lesions are identified. Neck is clear without evidence of cervical or supraclavicular adenopathy. Lungs are clear to A&P. Cardiac examination is essentially unremarkable with regular rate and rhythm without murmur rub or thrill. Abdomen is benign with no organomegaly or masses noted. Motor sensory and DTR levels are equal and symmetric in the upper and lower extremities. Cranial nerves II through XII are grossly intact. Proprioception is intact. No peripheral adenopathy or edema is identified. No motor or sensory levels are noted. Crude visual  fields are within normal range.  RADIOLOGY RESULTS: mammograms are reviewed and compatible above-stated findings  PLAN: present time patient continues to well with no evidence of disease. I am please with her overall progress. I've asked to see her back in 1 year for follow-up. She continues on letrozole without side effect. Patient is to call with any concerns at any time.  I would like to take this opportunity to thank you for allowing me to participate in the care of your patient.Noreene Filbert, MD

## 2019-01-26 ENCOUNTER — Other Ambulatory Visit: Payer: Self-pay | Admitting: *Deleted

## 2019-01-26 NOTE — Telephone Encounter (Signed)
THis has been refilled already. Patient informed

## 2019-02-09 ENCOUNTER — Ambulatory Visit: Payer: PRIVATE HEALTH INSURANCE | Admitting: Oncology

## 2019-02-09 ENCOUNTER — Other Ambulatory Visit: Payer: PRIVATE HEALTH INSURANCE

## 2019-02-18 ENCOUNTER — Other Ambulatory Visit: Payer: Self-pay | Admitting: Oncology

## 2019-03-08 ENCOUNTER — Inpatient Hospital Stay: Payer: PRIVATE HEALTH INSURANCE | Attending: Oncology

## 2019-03-08 ENCOUNTER — Other Ambulatory Visit: Payer: Self-pay

## 2019-03-08 DIAGNOSIS — D0511 Intraductal carcinoma in situ of right breast: Secondary | ICD-10-CM

## 2019-03-08 LAB — CBC WITH DIFFERENTIAL/PLATELET
Abs Immature Granulocytes: 0.01 10*3/uL (ref 0.00–0.07)
Basophils Absolute: 0 10*3/uL (ref 0.0–0.1)
Basophils Relative: 0 %
Eosinophils Absolute: 0.1 10*3/uL (ref 0.0–0.5)
Eosinophils Relative: 1 %
HCT: 37.1 % (ref 36.0–46.0)
Hemoglobin: 12.5 g/dL (ref 12.0–15.0)
Immature Granulocytes: 0 %
Lymphocytes Relative: 37 %
Lymphs Abs: 1.8 10*3/uL (ref 0.7–4.0)
MCH: 30.8 pg (ref 26.0–34.0)
MCHC: 33.7 g/dL (ref 30.0–36.0)
MCV: 91.4 fL (ref 80.0–100.0)
Monocytes Absolute: 0.4 10*3/uL (ref 0.1–1.0)
Monocytes Relative: 8 %
Neutro Abs: 2.6 10*3/uL (ref 1.7–7.7)
Neutrophils Relative %: 54 %
Platelets: 239 10*3/uL (ref 150–400)
RBC: 4.06 MIL/uL (ref 3.87–5.11)
RDW: 13.3 % (ref 11.5–15.5)
WBC: 4.8 10*3/uL (ref 4.0–10.5)
nRBC: 0 % (ref 0.0–0.2)

## 2019-03-08 LAB — COMPREHENSIVE METABOLIC PANEL
ALT: 16 U/L (ref 0–44)
AST: 16 U/L (ref 15–41)
Albumin: 4.4 g/dL (ref 3.5–5.0)
Alkaline Phosphatase: 94 U/L (ref 38–126)
Anion gap: 9 (ref 5–15)
BUN: 13 mg/dL (ref 6–20)
CO2: 27 mmol/L (ref 22–32)
Calcium: 9.2 mg/dL (ref 8.9–10.3)
Chloride: 106 mmol/L (ref 98–111)
Creatinine, Ser: 0.65 mg/dL (ref 0.44–1.00)
GFR calc Af Amer: >60 mL/min (ref >60)
GFR calc non Af Amer: >60 mL/min (ref >60)
Glucose, Bld: 100 mg/dL — ABNORMAL HIGH (ref 70–99)
Potassium: 4.1 mmol/L (ref 3.5–5.1)
Sodium: 142 mmol/L (ref 135–145)
Total Bilirubin: 0.7 mg/dL (ref 0.3–1.2)
Total Protein: 7.4 g/dL (ref 6.5–8.1)

## 2019-03-09 ENCOUNTER — Inpatient Hospital Stay: Payer: PRIVATE HEALTH INSURANCE

## 2019-03-09 ENCOUNTER — Other Ambulatory Visit: Payer: Self-pay

## 2019-03-09 ENCOUNTER — Encounter: Payer: Self-pay | Admitting: Oncology

## 2019-03-09 ENCOUNTER — Inpatient Hospital Stay: Payer: PRIVATE HEALTH INSURANCE | Attending: Oncology | Admitting: Oncology

## 2019-03-09 DIAGNOSIS — D0511 Intraductal carcinoma in situ of right breast: Secondary | ICD-10-CM

## 2019-03-09 DIAGNOSIS — Z9011 Acquired absence of right breast and nipple: Secondary | ICD-10-CM

## 2019-03-09 DIAGNOSIS — Z79899 Other long term (current) drug therapy: Secondary | ICD-10-CM

## 2019-03-09 DIAGNOSIS — Z79811 Long term (current) use of aromatase inhibitors: Secondary | ICD-10-CM | POA: Diagnosis not present

## 2019-03-09 DIAGNOSIS — M8580 Other specified disorders of bone density and structure, unspecified site: Secondary | ICD-10-CM

## 2019-03-09 MED ORDER — LETROZOLE 2.5 MG PO TABS: 2.5000 mg | ORAL_TABLET | Freq: Every day | ORAL | 11 refills | Status: DC

## 2019-03-09 NOTE — Progress Notes (Signed)
HEMATOLOGY-ONCOLOGY TeleHEALTH VISIT PROGRESS NOTE  I connected with Lacey Garcia on 03/09/19 at  1:30 PM EDT by video enabled telemedicine visit and verified that I am speaking with the correct person using two identifiers. I discussed the limitations, risks, security and privacy concerns of performing an evaluation and management service by telemedicine and the availability of in-person appointments. I also discussed with the patient that there may be a patient responsible charge related to this service. The patient expressed understanding and agreed to proceed.   Other persons participating in the visit and their role in the encounter:  Geraldine Solar, [place], check in patient   Janeann Merl, RN, check in patient.   Patient's location: Home  Provider's location: home Chief Complaint: 17-month follow-up for DCIS   INTERVAL HISTORY Lacey Garcia is a 58 y.o. female who has above history reviewed by me today presents for 6 months follow up visit for management of DCIS. Problems and complaints are listed below:  Patient takes letrozole 2.5 mg daily.  Reports having some chronic joint achiness and hot flash.  Manageable. No new complaints.  She had bilateral diagnostic mammogram in October 2019 which showed no mammographic evidence of malignancy in either breast.  Status post right lumpectomy. Today she has no new complaints.  She does not have any concerns about her breast.  Review of Systems  Constitutional: Negative for appetite change, chills, fatigue and fever.  HENT:   Negative for hearing loss and voice change.   Eyes: Negative for eye problems.  Respiratory: Negative for chest tightness and cough.   Cardiovascular: Negative for chest pain.  Gastrointestinal: Negative for abdominal distention, abdominal pain and blood in stool.  Endocrine: Negative for hot flashes.  Genitourinary: Negative for difficulty urinating and frequency.   Musculoskeletal: Negative for arthralgias.  Skin:  Negative for itching and rash.  Neurological: Negative for extremity weakness.  Hematological: Negative for adenopathy.  Psychiatric/Behavioral: Negative for confusion.    Past Medical History:  Diagnosis Date  . Breast cancer ([place]) 08/2017   right breast  . Cancer ([place])    melanoma  . Complication of anesthesia   . GERD (gastroesophageal reflux disease)    rare-no meds  . History of kidney stones 1991  . Melanoma ([place]) 1993   Melanoma x 2 times.   Marland Kitchen PONV (postoperative nausea and vomiting)    Past Surgical History:  Procedure Laterality Date  . BREAST BIOPSY Right 09/01/2017   AFFIRM Stereo/intraductal carcinoma in situ  . BREAST LUMPECTOMY Right 09/27/2017   intraductal cacinoma in situ with radiation  . coloscopy  2014  . FRACTURE SURGERY    . HAND SURGERY     trigger finger release  . leg left rod and pins    . LITHOTRIPSY    . MELANOMA EXCISION     left leg and right leg  . PARTIAL MASTECTOMY WITH NEEDLE LOCALIZATION Right 09/27/2017   Procedure: PARTIAL MASTECTOMY WITH NEEDLE LOCALIZATION;  Surgeon: Leonie Green, MD;  Location: [facility] ORS;  Service: General;  Laterality: Right;  . SENTINEL NODE BIOPSY Right 09/27/2017   Procedure: SENTINEL NODE BIOPSY;  Surgeon: Leonie Green, MD;  Location: [facility] ORS;  Service: General;  Laterality: Right;    Family History  Problem Relation Age of Onset  . Hypertension Mother   . Heart disease Mother   . Diabetes Father   . Hypertension Father   . Heart disease Father   . Breast cancer Neg Hx     Social History  Socioeconomic History  . Marital status: Married    Spouse name: Not on file  . Number of children: Not on file  . Years of education: Not on file  . Highest education level: Not on file  Occupational History  . Not on file  Social Needs  . Financial resource strain: Not on file  . Food insecurity:    Worry: Not on file    Inability: Not on file  . Transportation needs:    Medical: Not on  file    Non-medical: Not on file  Tobacco Use  . Smoking status: Never Smoker  . Smokeless tobacco: Never Used  . Tobacco comment: no tobacco use  Substance and Sexual Activity  . Alcohol use: No    Frequency: Never  . Drug use: No  . Sexual activity: Yes  Lifestyle  . Physical activity:    Days per week: Not on file    Minutes per session: Not on file  . Stress: Not on file  Relationships  . Social connections:    Talks on phone: Not on file    Gets together: Not on file    Attends religious service: Not on file    Active member of club or organization: Not on file    Attends meetings of clubs or organizations: Not on file    Relationship status: Not on file  . Intimate partner violence:    Fear of current or ex partner: Not on file    Emotionally abused: Not on file    Physically abused: Not on file    Forced sexual activity: Not on file  Other Topics Concern  . Not on file  Social History Narrative  . Not on file    Current Outpatient Medications on File Prior to Visit  Medication Sig Dispense Refill  . cetirizine (ZYRTEC) 10 MG tablet Take 10 mg by mouth daily.    . Cholecalciferol (VITAMIN D-3) 5000 units TABS Take 1 tablet daily by mouth.    . CoenzymeQ10-Isoleucine-Glycine (CO Q-10) 100-50-25 MG TB24 Co Q-10    . ibuprofen (ADVIL,MOTRIN) 200 MG tablet Take 200 mg by mouth every 6 (six) hours as needed.    . Melatonin 10-10 MG TBCR Take 1 capsule by mouth every evening.    . Multiple Vitamin (MULTI-VITAMIN DAILY PO) Multi Vitamin     No current facility-administered medications on file prior to visit.     Allergies  Allergen Reactions  . Sulfa Antibiotics Hives and Rash       Observations/Objective: Today's Vitals   03/09/19 1031  PainSc: 0-No pain   There is no height or weight on file to calculate BMI.  Physical Exam  Constitutional: She is oriented to person, place, and time and well-developed, well-nourished, and in no distress. No distress.  HENT:   Head: Normocephalic.  Pulmonary/Chest: Effort normal.  Neurological: She is alert and oriented to person, place, and time.  Psychiatric: Affect normal.    CBC    Component Value Date/Time   WBC 4.8 03/08/2019 1057   RBC 4.06 03/08/2019 1057   HGB 12.5 03/08/2019 1057   HCT 37.1 03/08/2019 1057   PLT 239 03/08/2019 1057   MCV 91.4 03/08/2019 1057   MCH 30.8 03/08/2019 1057   MCHC 33.7 03/08/2019 1057   RDW 13.3 03/08/2019 1057   LYMPHSABS 1.8 03/08/2019 1057   MONOABS 0.4 03/08/2019 1057   EOSABS 0.1 03/08/2019 1057   BASOSABS 0.0 03/08/2019 1057    CMP  Component Value Date/Time   NA 142 03/08/2019 1057   K 4.1 03/08/2019 1057   CL 106 03/08/2019 1057   CO2 27 03/08/2019 1057   GLUCOSE 100 (H) 03/08/2019 1057   BUN 13 03/08/2019 1057   CREATININE 0.65 03/08/2019 1057   CALCIUM 9.2 03/08/2019 1057   PROT 7.4 03/08/2019 1057   ALBUMIN 4.4 03/08/2019 1057   AST 16 03/08/2019 1057   ALT 16 03/08/2019 1057   ALKPHOS 94 03/08/2019 1057   BILITOT 0.7 03/08/2019 1057   GFRNONAA >60 03/08/2019 1057   GFRAA >60 03/08/2019 1057     Assessment and Plan: 1. Ductal carcinoma in situ (DCIS) of right breast   2. Osteopenia, unspecified location     Labs reviewed and discussed with patient.  Labs are unremarkable. Clinically tolerating to letrozole well.  Manageable vasomotor symptoms. We will obtain annual diagnostic mammogram which is due in October 2020. Recommend patient to follow-up every 6 months.  She prefers to follow-up annually. I recommend patient to alternate between me and breast surgeon every 6 months. She will call Dr. Deniece Ree office after mammogram. Letrozole prescription was renewed  Osteopenia, recommend patient to continue take calcium 1000 mg daily and vitamin D 1000 units daily. Bisphosphonate treatment was previously discussed and she declined. Follow Up Instructions: 1 year   I discussed the assessment and treatment plan with the patient. The  patient was provided an opportunity to ask questions and all were answered. The patient agreed with the plan and demonstrated an understanding of the instructions.  The patient was advised to call back or seek an in-person evaluation if the symptoms worsen or if the condition fails to improve as anticipated.   I provided 15 minutes of face-to-face video visit time during this encounter, and > 50% was spent counseling as documented under my assessment & plan.  Earlie Server, MD 03/09/2019 8:50 PM

## 2019-03-09 NOTE — Progress Notes (Signed)
Called patient for Telehealth visit via [employer].  Patient states no new concerns today.  

## 2019-05-06 IMAGING — MG MM BREAST BX W/ LOC DEV 1ST LESION IMAGE BX SPEC STEREO GUIDE*R*
8 of 27 series · 8 of 39 positions shown · non-contrast
Comparison: Previous exams.

ADDENDUM:
The final pathological diagnosis is high-grade ductal carcinoma in
situ with comedonecrosis and calcifications. Microinvasive carcinoma
could not be excluded. This is concordant with the imaging findings.
This will be communicated to the nurse navigators at [REDACTED] and they will arrange a consultation appointment
for the patient.

The final pathological diagnosis and recommendation were discussed
with the patient by telephone on 09/06/2017. Her questions were
answered. She reported mild bruising at the biopsy site with no pain
or palpable hematoma.
At the patient's request, a surgical referral was made with Dr.
Mir for 09/09/17 at [DATE] by Kancho Milan Sukalic, RN, nurse navigator for
[HOSPITAL]. The patient has been notified of
the appointment.
Addendum by Joshjax, [employer] on 09/08/17.
CLINICAL DATA: Patient with right breast calcifications presents
today for stereotactic biopsy using 3D tomosynthesis guidance.
EXAM:
RIGHT BREAST STEREOTACTIC CORE NEEDLE BIOPSY

[R (1 of 8)]
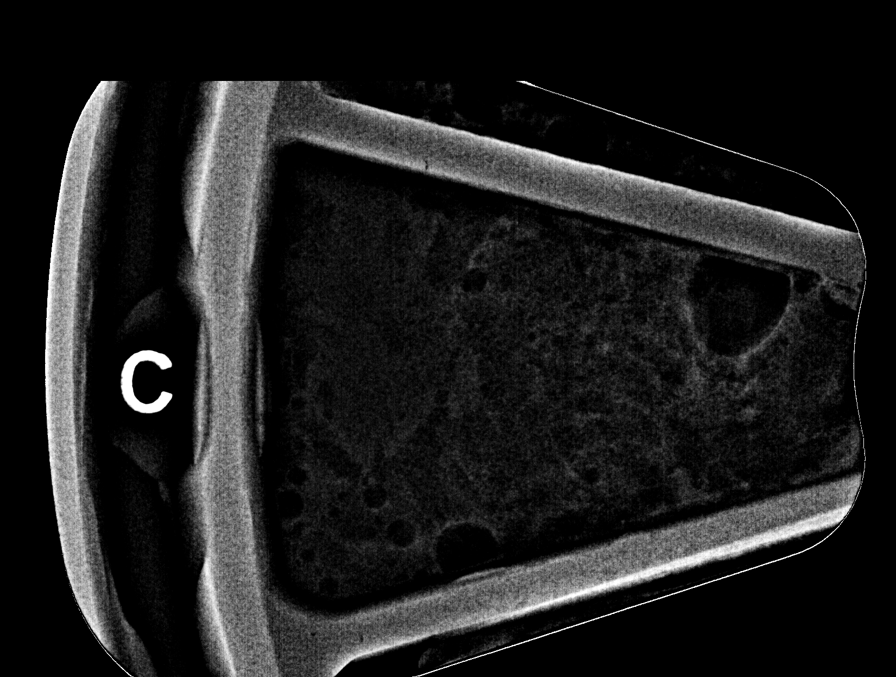

[R (2 of 8)]
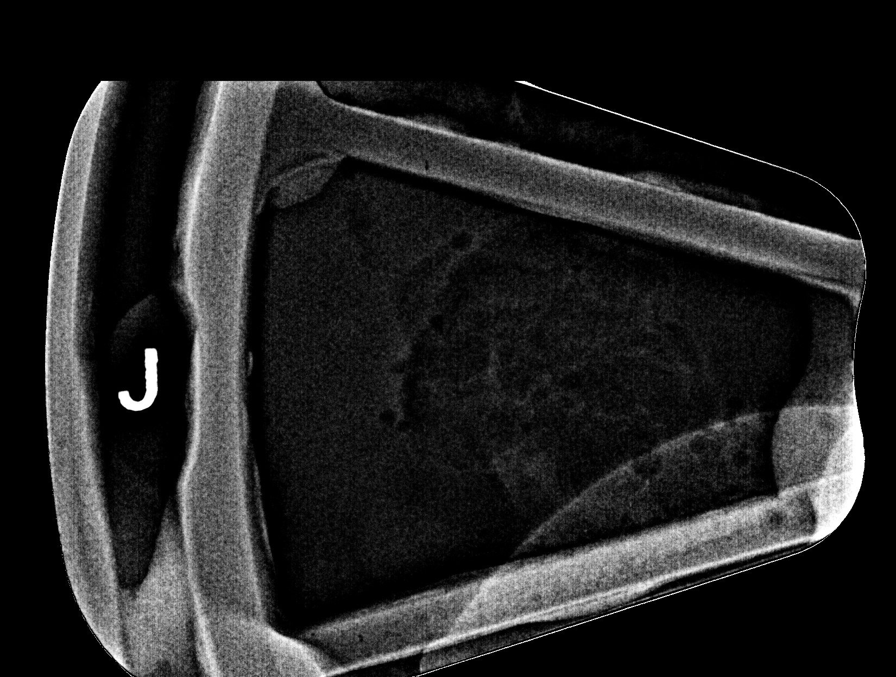

[R (3 of 8)]
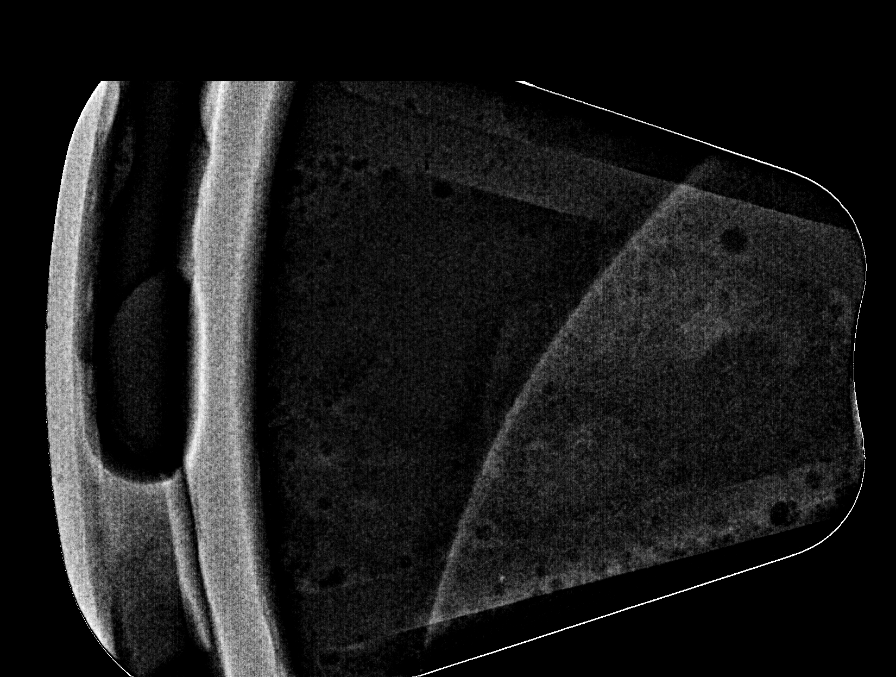

[R (4 of 8)]
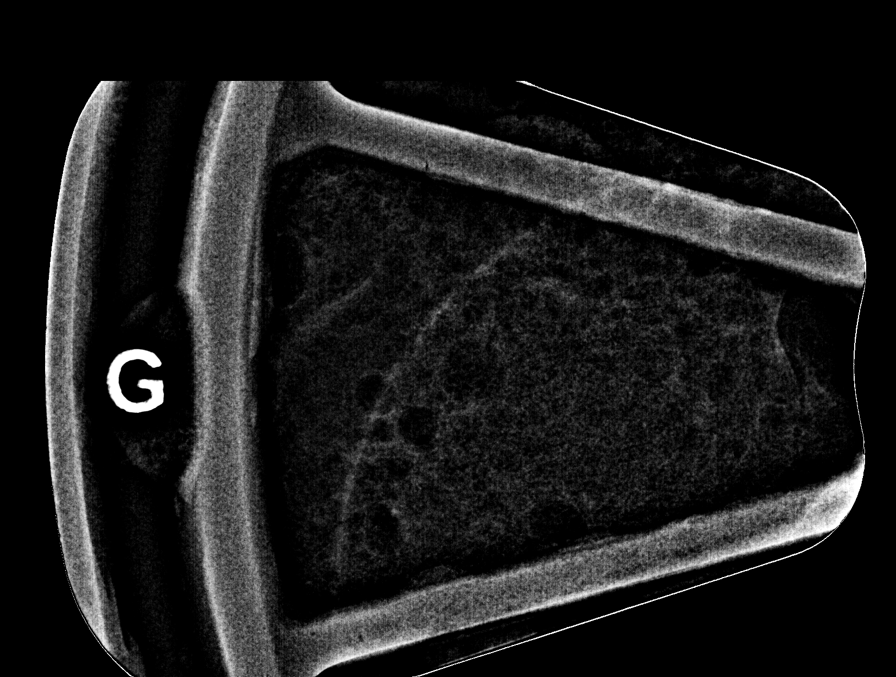

[R (5 of 8)]
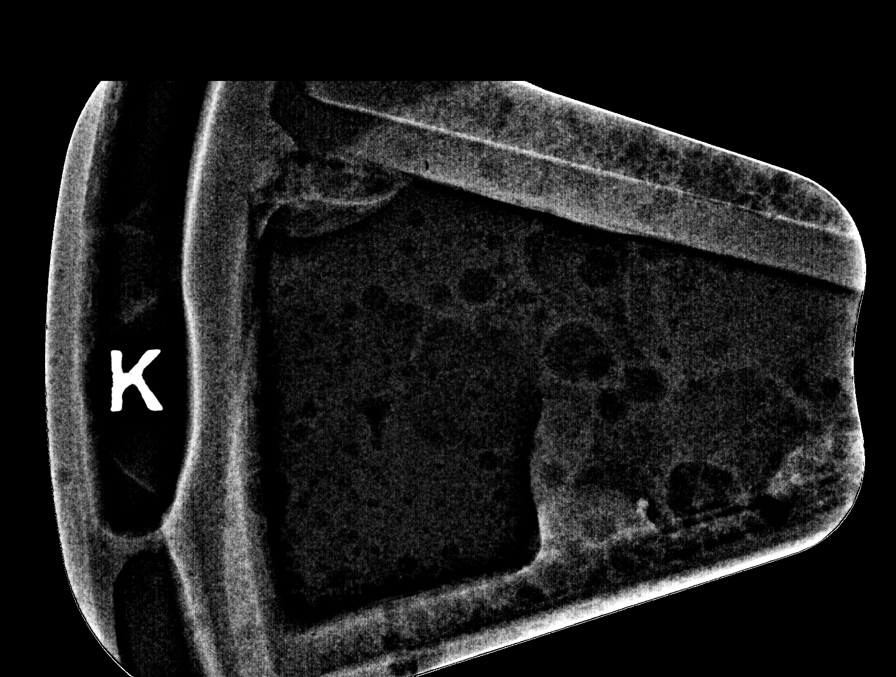

[R (6 of 8)]
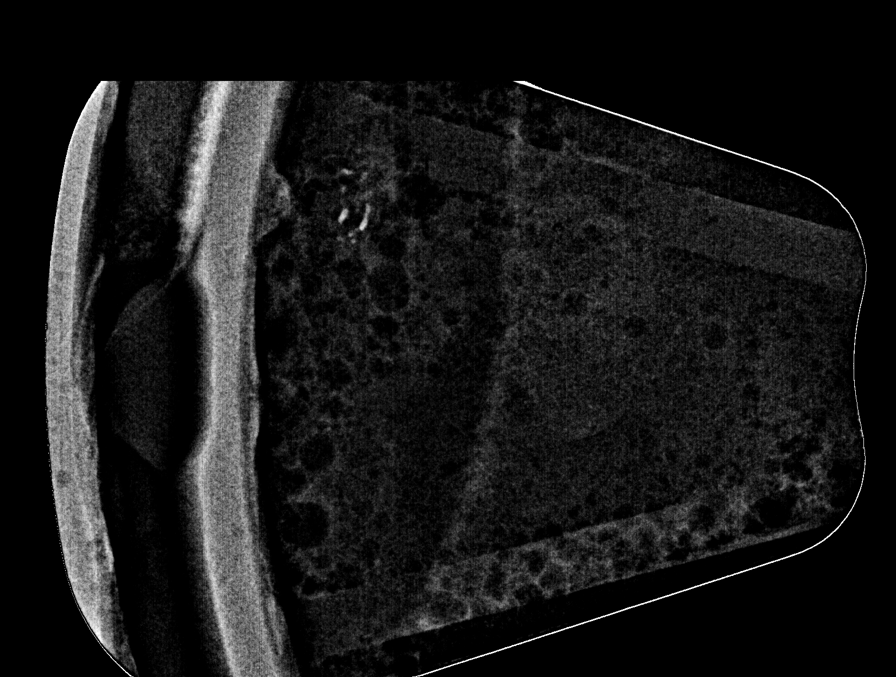

[R (7 of 8)]
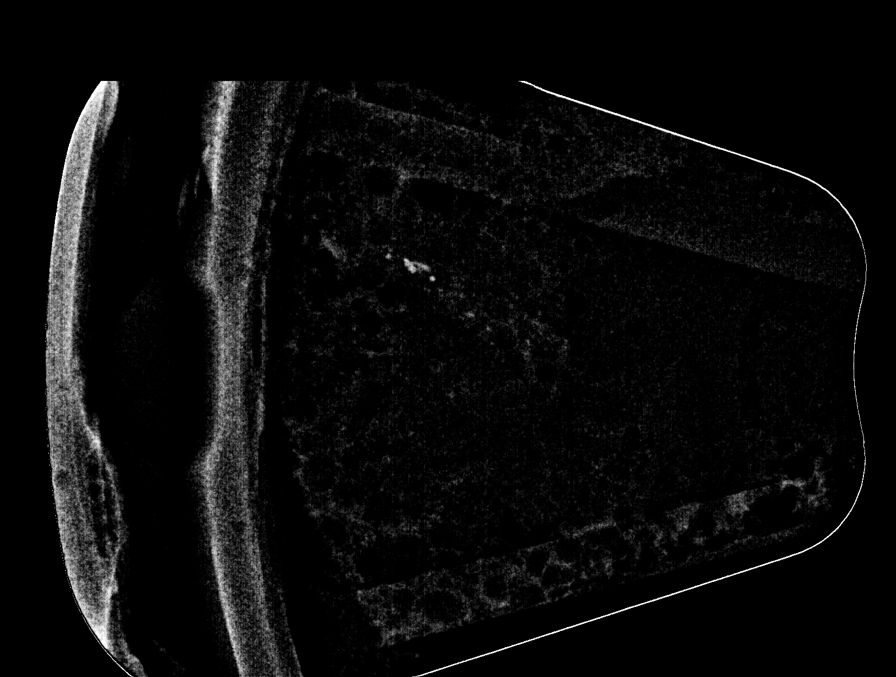

[R (8 of 8)]
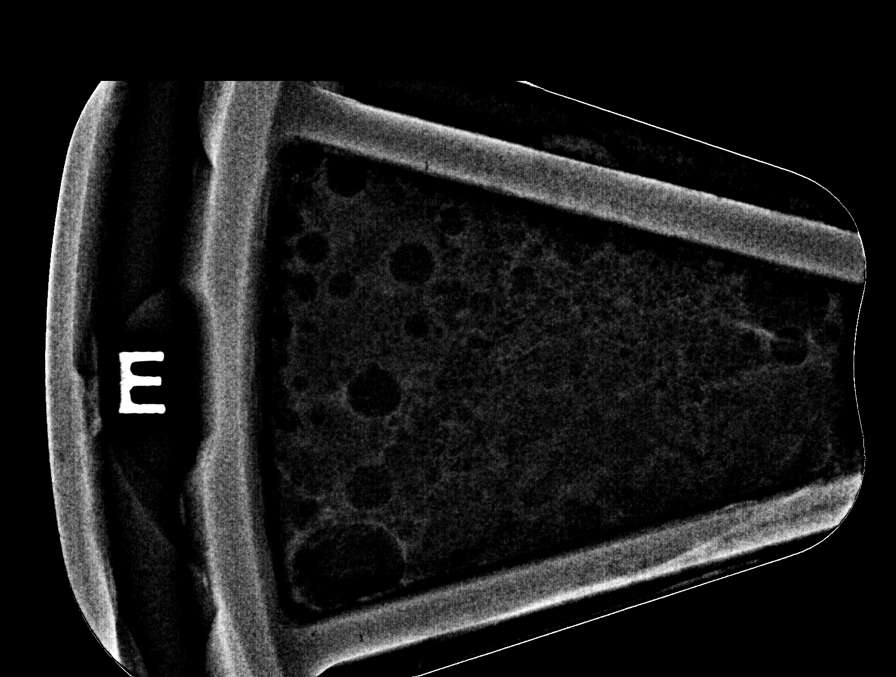

[8 of 39 positions shown; findings below may reference images not displayed]



Using sterile technique and 1% Lidocaine as local anesthetic, under
stereotactic guidance, a 9 gauge vacuum assisted device was used to
perform core needle biopsy of calcifications in the upper-outer
quadrant of the right breast using a lateral approach. Specimen
radiograph was performed showing calcifications. Specimens with
calcifications are identified for pathology.

Lesion quadrant: Upper outer quadrant

At the conclusion of the procedure, a ribbon shaped tissue marker
clip was deployed into the biopsy cavity. Follow-up 2-view mammogram
was performed and dictated separately.
IMPRESSION: Stereotactic-guided biopsy of calcifications in the upper-outer
quadrant of the right breast.. No apparent complications.

## 2019-08-20 ENCOUNTER — Other Ambulatory Visit: Payer: PRIVATE HEALTH INSURANCE

## 2019-08-20 ENCOUNTER — Ambulatory Visit: Payer: PRIVATE HEALTH INSURANCE

## 2019-08-31 ENCOUNTER — Ambulatory Visit
Admission: RE | Admit: 2019-08-31 | Discharge: 2019-08-31 | Disposition: A | Discharge status: Home / Self Care | Payer: PRIVATE HEALTH INSURANCE | Source: Ambulatory Visit | Attending: Oncology | Admitting: Oncology

## 2019-08-31 DIAGNOSIS — D0511 Intraductal carcinoma in situ of right breast: Secondary | ICD-10-CM | POA: Diagnosis not present

## 2019-10-18 DIAGNOSIS — M6530 Trigger finger, unspecified finger: Secondary | ICD-10-CM | POA: Insufficient documentation

## 2019-11-21 ENCOUNTER — Inpatient Hospital Stay
Admission: EM | Admit: 2019-11-21 | Discharge: 2019-11-25 | DRG: 177 | Disposition: A | Discharge status: Home / Self Care | Payer: PRIVATE HEALTH INSURANCE | Attending: Internal Medicine | Admitting: Internal Medicine

## 2019-11-21 ENCOUNTER — Emergency Department: Payer: PRIVATE HEALTH INSURANCE

## 2019-11-21 ENCOUNTER — Other Ambulatory Visit: Payer: Self-pay

## 2019-11-21 ENCOUNTER — Encounter: Payer: Self-pay | Admitting: Emergency Medicine

## 2019-11-21 DIAGNOSIS — Z853 Personal history of malignant neoplasm of breast: Secondary | ICD-10-CM | POA: Diagnosis not present

## 2019-11-21 DIAGNOSIS — K219 Gastro-esophageal reflux disease without esophagitis: Secondary | ICD-10-CM | POA: Diagnosis present

## 2019-11-21 DIAGNOSIS — Z79811 Long term (current) use of aromatase inhibitors: Secondary | ICD-10-CM

## 2019-11-21 DIAGNOSIS — Z87442 Personal history of urinary calculi: Secondary | ICD-10-CM

## 2019-11-21 DIAGNOSIS — C50911 Malignant neoplasm of unspecified site of right female breast: Secondary | ICD-10-CM | POA: Diagnosis present

## 2019-11-21 DIAGNOSIS — Z882 Allergy status to sulfonamides status: Secondary | ICD-10-CM | POA: Diagnosis not present

## 2019-11-21 DIAGNOSIS — J9601 Acute respiratory failure with hypoxia: Secondary | ICD-10-CM | POA: Diagnosis present

## 2019-11-21 DIAGNOSIS — Z8249 Family history of ischemic heart disease and other diseases of the circulatory system: Secondary | ICD-10-CM

## 2019-11-21 DIAGNOSIS — Z79899 Other long term (current) drug therapy: Secondary | ICD-10-CM | POA: Diagnosis not present

## 2019-11-21 DIAGNOSIS — J1282 Pneumonia due to coronavirus disease 2019: Secondary | ICD-10-CM | POA: Diagnosis present

## 2019-11-21 DIAGNOSIS — R079 Chest pain, unspecified: Secondary | ICD-10-CM | POA: Diagnosis not present

## 2019-11-21 DIAGNOSIS — Z85820 Personal history of malignant melanoma of skin: Secondary | ICD-10-CM

## 2019-11-21 DIAGNOSIS — R0602 Shortness of breath: Secondary | ICD-10-CM

## 2019-11-21 DIAGNOSIS — Z923 Personal history of irradiation: Secondary | ICD-10-CM | POA: Diagnosis not present

## 2019-11-21 DIAGNOSIS — U071 COVID-19: Secondary | ICD-10-CM | POA: Diagnosis present

## 2019-11-21 DIAGNOSIS — Z833 Family history of diabetes mellitus: Secondary | ICD-10-CM | POA: Diagnosis not present

## 2019-11-21 LAB — CBC
HCT: 39.2 % (ref 36.0–46.0)
Hemoglobin: 12.7 g/dL (ref 12.0–15.0)
MCH: 30 pg (ref 26.0–34.0)
MCHC: 32.4 g/dL (ref 30.0–36.0)
MCV: 92.7 fL (ref 80.0–100.0)
Platelets: 145 10*3/uL — ABNORMAL LOW (ref 150–400)
RBC: 4.23 MIL/uL (ref 3.87–5.11)
RDW: 13.2 % (ref 11.5–15.5)
WBC: 3.2 10*3/uL — ABNORMAL LOW (ref 4.0–10.5)
nRBC: 0 % (ref 0.0–0.2)

## 2019-11-21 LAB — BASIC METABOLIC PANEL
Anion gap: 11 (ref 5–15)
BUN: 12 mg/dL (ref 6–20)
CO2: 29 mmol/L (ref 22–32)
Calcium: 8.8 mg/dL — ABNORMAL LOW (ref 8.9–10.3)
Chloride: 101 mmol/L (ref 98–111)
Creatinine, Ser: 0.63 mg/dL (ref 0.44–1.00)
GFR calc Af Amer: >60 mL/min (ref >60)
GFR calc non Af Amer: >60 mL/min (ref >60)
Glucose, Bld: 120 mg/dL — ABNORMAL HIGH (ref 70–99)
Potassium: 3.5 mmol/L (ref 3.5–5.1)
Sodium: 141 mmol/L (ref 135–145)

## 2019-11-21 LAB — PROCALCITONIN: Procalcitonin: <0.1 ng/mL

## 2019-11-21 LAB — TROPONIN I (HIGH SENSITIVITY)
Troponin I (High Sensitivity): 4 ng/L (ref <18)
Troponin I (High Sensitivity): 3 ng/L (ref <18)

## 2019-11-21 LAB — FERRITIN: Ferritin: 282 ng/mL (ref 11–307)

## 2019-11-21 LAB — FIBRIN DERIVATIVES D-DIMER (ARMC ONLY): Fibrin derivatives D-dimer (ARMC): 722.84 ng/mL (FEU) — ABNORMAL HIGH (ref 0.00–499.00)

## 2019-11-21 LAB — ABO/RH: ABO/RH(D): A NEG

## 2019-11-21 LAB — LACTATE DEHYDROGENASE: LDH: 283 U/L — ABNORMAL HIGH (ref 98–192)

## 2019-11-21 LAB — BRAIN NATRIURETIC PEPTIDE: B Natriuretic Peptide: 27 pg/mL (ref 0.0–100.0)

## 2019-11-21 LAB — POC SARS CORONAVIRUS 2 AG -  ED: SARS Coronavirus 2 Ag: NEGATIVE

## 2019-11-21 MED ORDER — ALBUTEROL SULFATE HFA 108 (90 BASE) MCG/ACT IN AERS
2.0000 | INHALATION_SPRAY | Freq: Once | RESPIRATORY_TRACT | Status: AC
  Administered 2019-11-21: 2 via RESPIRATORY_TRACT
  Filled 2019-11-21 (×2): qty 6.7

## 2019-11-21 MED ORDER — ACETAMINOPHEN 325 MG PO TABS
650.0000 mg | ORAL_TABLET | Freq: Four times a day (QID) | ORAL | Status: DC | PRN
  Administered 2019-11-21 – 2019-11-22 (×2): 650 mg via ORAL
  Filled 2019-11-21 (×2): qty 2

## 2019-11-21 MED ORDER — ZINC SULFATE 220 (50 ZN) MG PO CAPS
220.0000 mg | ORAL_CAPSULE | Freq: Every day | ORAL | Status: DC
  Administered 2019-11-21 – 2019-11-25 (×5): 220 mg via ORAL
  Filled 2019-11-21 (×5): qty 1

## 2019-11-21 MED ORDER — VITAMIN D 25 MCG (1000 UNIT) PO TABS
1000.0000 [IU] | ORAL_TABLET | Freq: Every day | ORAL | Status: DC
  Administered 2019-11-21 – 2019-11-25 (×5): 1000 [IU] via ORAL
  Filled 2019-11-21 (×5): qty 1

## 2019-11-21 MED ORDER — ADULT MULTIVITAMIN W/MINERALS CH
1.0000 | ORAL_TABLET | Freq: Every day | ORAL | Status: DC
  Administered 2019-11-22 – 2019-11-25 (×4): 1 via ORAL
  Filled 2019-11-21 (×4): qty 1

## 2019-11-21 MED ORDER — MELATONIN 5 MG PO TABS
2.0000 | ORAL_TABLET | Freq: Every day | ORAL | Status: DC
  Administered 2019-11-22 – 2019-11-24 (×4): 10 mg via ORAL
  Filled 2019-11-21 (×6): qty 2

## 2019-11-21 MED ORDER — GUAIFENESIN ER 600 MG PO TB12
600.0000 mg | ORAL_TABLET | Freq: Two times a day (BID) | ORAL | Status: DC
  Administered 2019-11-21 – 2019-11-25 (×8): 600 mg via ORAL
  Filled 2019-11-21 (×8): qty 1

## 2019-11-21 MED ORDER — VITAMIN D-3 125 MCG (5000 UT) PO TABS: 1.0000 | ORAL_TABLET | Freq: Every day | ORAL | Status: DC

## 2019-11-21 MED ORDER — DEXAMETHASONE SODIUM PHOSPHATE 10 MG/ML IJ SOLN
6.0000 mg | INTRAMUSCULAR | Status: DC
  Administered 2019-11-22 – 2019-11-25 (×4): 6 mg via INTRAVENOUS
  Filled 2019-11-21 (×2): qty 1
  Filled 2019-11-21: qty 0.6
  Filled 2019-11-21: qty 1

## 2019-11-21 MED ORDER — SODIUM CHLORIDE 0.9% FLUSH: 3.0000 mL | Freq: Once | INTRAVENOUS | Status: DC

## 2019-11-21 MED ORDER — ASPIRIN EC 81 MG PO TBEC
81.0000 mg | DELAYED_RELEASE_TABLET | Freq: Every day | ORAL | Status: DC
  Administered 2019-11-22 – 2019-11-25 (×4): 81 mg via ORAL
  Filled 2019-11-21 (×4): qty 1

## 2019-11-21 MED ORDER — ENOXAPARIN SODIUM 40 MG/0.4ML ~~LOC~~ SOLN
40.0000 mg | SUBCUTANEOUS | Status: DC
  Administered 2019-11-21 – 2019-11-24 (×4): 40 mg via SUBCUTANEOUS
  Filled 2019-11-21 (×4): qty 0.4

## 2019-11-21 MED ORDER — SODIUM CHLORIDE 0.9 % IV SOLN
200.0000 mg | Freq: Once | INTRAVENOUS | Status: AC
  Administered 2019-11-21: 200 mg via INTRAVENOUS
  Filled 2019-11-21: qty 40

## 2019-11-21 MED ORDER — ASCORBIC ACID 500 MG PO TABS
500.0000 mg | ORAL_TABLET | Freq: Every day | ORAL | Status: DC
  Administered 2019-11-21 – 2019-11-25 (×5): 500 mg via ORAL
  Filled 2019-11-21 (×5): qty 1

## 2019-11-21 MED ORDER — LETROZOLE 2.5 MG PO TABS
2.5000 mg | ORAL_TABLET | Freq: Every day | ORAL | Status: DC
  Administered 2019-11-22 – 2019-11-25 (×4): 2.5 mg via ORAL
  Filled 2019-11-21 (×4): qty 1

## 2019-11-21 MED ORDER — SODIUM CHLORIDE 0.9 % IV SOLN
100.0000 mg | Freq: Every day | INTRAVENOUS | Status: AC
  Administered 2019-11-22 – 2019-11-25 (×4): 100 mg via INTRAVENOUS
  Filled 2019-11-21 (×4): qty 100

## 2019-11-21 MED ORDER — SODIUM CHLORIDE 0.9 % IV SOLN: INTRAVENOUS | Status: DC

## 2019-11-21 MED ORDER — GUAIFENESIN-DM 100-10 MG/5ML PO SYRP
10.0000 mL | ORAL_SOLUTION | ORAL | Status: DC | PRN
  Administered 2019-11-22: 10 mL via ORAL
  Filled 2019-11-21 (×3): qty 10

## 2019-11-21 MED ORDER — ONDANSETRON HCL 4 MG PO TABS: 4.0000 mg | ORAL_TABLET | Freq: Four times a day (QID) | ORAL | Status: DC | PRN

## 2019-11-21 MED ORDER — ACETAMINOPHEN 325 MG PO TABS
650.0000 mg | ORAL_TABLET | Freq: Once | ORAL | Status: AC | PRN
  Administered 2019-11-21: 650 mg via ORAL
  Filled 2019-11-21: qty 2

## 2019-11-21 MED ORDER — TRAZODONE HCL 50 MG PO TABS
25.0000 mg | ORAL_TABLET | Freq: Every evening | ORAL | Status: DC | PRN
  Filled 2019-11-21: qty 1

## 2019-11-21 MED ORDER — LORATADINE 10 MG PO TABS
10.0000 mg | ORAL_TABLET | Freq: Every day | ORAL | Status: DC
  Administered 2019-11-22 – 2019-11-25 (×4): 10 mg via ORAL
  Filled 2019-11-21 (×5): qty 1

## 2019-11-21 MED ORDER — FAMOTIDINE 20 MG PO TABS
20.0000 mg | ORAL_TABLET | Freq: Two times a day (BID) | ORAL | Status: DC
  Administered 2019-11-21 – 2019-11-25 (×8): 20 mg via ORAL
  Filled 2019-11-21 (×8): qty 1

## 2019-11-21 MED ORDER — ONDANSETRON HCL 4 MG/2ML IJ SOLN
4.0000 mg | Freq: Four times a day (QID) | INTRAMUSCULAR | Status: DC | PRN
  Filled 2019-11-21: qty 2

## 2019-11-21 MED ORDER — HYDROCOD POLST-CPM POLST ER 10-8 MG/5ML PO SUER
5.0000 mL | Freq: Two times a day (BID) | ORAL | Status: DC | PRN
  Administered 2019-11-22 – 2019-11-23 (×2): 5 mL via ORAL
  Filled 2019-11-21 (×2): qty 5

## 2019-11-21 MED ORDER — MAGNESIUM HYDROXIDE 400 MG/5ML PO SUSP: 30.0000 mL | Freq: Every day | ORAL | Status: DC | PRN

## 2019-11-21 NOTE — Consult Note (Signed)
Remdesivir - Pharmacy Brief Note   O:  CXR: Patchy bibasilar airspace opacities suspicious for multifocal atypical/viral pneumonia. SpO2: 88-90% on Room Air   A/P:  Remdesivir 200 mg IVPB once followed by 100 mg IVPB daily x 4 days.   Pernell Dupre, PharmD, BCPS Clinical Pharmacist 11/21/2019 8:21 PM

## 2019-11-21 NOTE — ED Notes (Signed)
Pt uable to

## 2019-11-21 NOTE — ED Triage Notes (Signed)
Pt presents to ED via POV, states just received results from [facility] that is Covid+, upon arrival pt 88-90% RA, pt placed on 2L via  upon arrival to ED.

## 2019-11-21 NOTE — ED Notes (Signed)
ED TO INPATIENT HANDOFF REPORT  ED Nurse Name and Phone #: Joelene Millin [phone]  S Name/Age/Gender Lacey Garcia 59 y.o. female Room/Bed: ED37A/ED37A  Code Status   Code Status: Full Code  Home/SNF/Other Home Patient oriented to: self, place, time and situation Is this baseline? Yes   Triage Complete: Triage complete  Chief Complaint COVID-19 [U07.1]  Triage Note Pt presents to ED via POV, states [facility] Cov[facility] upon arrival pt 88-90% RA, pt placed on 2L via [place] upon arrival to ED.     Allergies Allergies  Allergen Reactions  . Sulfa Antibiotics Hives and Rash    Level of Care/Admitting Diagnosis ED Disposition    ED Disposition Condition Comment   Admit  Hospital Area: [facility] [[id number]]  Level of Care: Med-Surg [16]  Covid Evaluation: Confirmed COVID Positive  Diagnosis: COVID-19 [[id number]]  Admitting Physician: Christel Mormon [[id number]]  Attending Physician: Christel Mormon [[id number]]  Estimated length of stay: 3 - 4 days  Certification:: I certify this patient will need inpatient services for at least 2 midnights       B Medical/Surgery History Past Medical History:  Diagnosis Date  . Breast cancer ([place]) 08/2017   right breast  . Cancer ([place])    melanoma  . Complication of anesthesia   . GERD (gastroesophageal reflux disease)    rare-no meds  . History of kidney stones 1991  . Melanoma ([place]) 1993   Melanoma x 2 times.   . Personal history of radiation therapy 2018   RIGHT lumpectomy  . PONV (postoperative nausea and vomiting)    Past Surgical History:  Procedure Laterality Date  . BREAST BIOPSY Right 09/01/2017   AFFIRM Stereo/intraductal carcinoma in situ  . BREAST LUMPECTOMY Right 09/27/2017   intraductal cacinoma in situ with radiation  . coloscopy  2014  . FRACTURE SURGERY    . HAND SURGERY     trigger finger release  . leg left rod and pins    . LITHOTRIPSY    . MELANOMA EXCISION      left leg and right leg  . PARTIAL MASTECTOMY WITH NEEDLE LOCALIZATION Right 09/27/2017   Procedure: PARTIAL MASTECTOMY WITH NEEDLE LOCALIZATION;  Surgeon: Leonie Green, MD;  Location: [facility] ORS;  Service: General;  Laterality: Right;  . SENTINEL NODE BIOPSY Right 09/27/2017   Procedure: SENTINEL NODE BIOPSY;  Surgeon: Leonie Green, MD;  Location: [facility] ORS;  Service: General;  Laterality: Right;     A IV Location/Drains/Wounds Patient Lines/Drains/Airways Status   Active Line/Drains/Airways    Name:   Placement date:   Placement time:   Site:   Days:   Peripheral IV 11/21/19 Left Antecubital   11/21/19    1906    Antecubital   less than 1   Incision (Closed) 09/27/17 Breast Right   09/27/17    1254     785   Incision (Closed) 09/27/17 Axilla Right   09/27/17    1315     785          Intake/Output Last 24 hours No intake or output data in the 24 hours ending 11/21/19 2253  Labs/Imaging Results for orders placed or performed during the hospital encounter of 11/21/19 (from the past 48 hour(s))  Basic metabolic panel     Status: Abnormal   Collection Time: 11/21/19 12:52 PM  Result Value Ref Range   Sodium 141 135 - 145 mmol/L   Potassium 3.5 3.5 - 5.1  mmol/L   Chloride 101 98 - 111 mmol/L   CO2 29 22 - 32 mmol/L   Glucose, Bld 120 (H) 70 - 99 mg/dL   BUN 12 6 - 20 mg/dL   Creatinine, Ser 0.63 0.44 - 1.00 mg/dL   Calcium 8.8 (L) 8.9 - 10.3 mg/dL   GFR calc non Af Amer >60 >60 mL/min   GFR calc Af Amer >60 >60 mL/min   Anion gap 11 5 - 15    Comment: Performed at [facility], [address] [place], [place] [address]  CBC     Status: Abnormal   Collection Time: 11/21/19 12:52 PM  Result Value Ref Range   WBC 3.2 (L) 4.0 - 10.5 K/uL   RBC 4.23 3.87 - 5.11 MIL/uL   Hemoglobin 12.7 12.0 - 15.0 g/dL   HCT 39.2 36.0 - 46.0 %   MCV 92.7 80.0 - 100.0 fL   MCH 30.0 26.0 - 34.0 pg   MCHC 32.4 30.0 - 36.0 g/dL   RDW 13.2 11.5 - 15.5 %   Platelets 145 (L)  150 - 400 K/uL   nRBC 0.0 0.0 - 0.2 %    Comment: Performed at [facility], [place], [place] [address]  Troponin I (High Sensitivity)     Status: None   Collection Time: 11/21/19  7:00 PM  Result Value Ref Range   Troponin I (High Sensitivity) 4 <18 ng/L    Comment: (NOTE) Elevated high sensitivity troponin I (hsTnI) values and significant  changes across serial measurements may suggest ACS but many other  chronic and acute conditions are known to elevate hsTnI results.  Refer to the "Links" section for chest pain algorithms and additional  guidance. Performed at [facility], [place], [place] [address]   POC SARS Coronavirus 2 Ag-ED - Nasal Swab (BD Veritor Kit)     Status: None   Collection Time: 11/21/19  7:06 PM  Result Value Ref Range   SARS Coronavirus 2 Ag Negative Negative  Troponin I (High Sensitivity)     Status: None   Collection Time: 11/21/19  8:53 PM  Result Value Ref Range   Troponin I (High Sensitivity) 3 <18 ng/L    Comment: (NOTE) Elevated high sensitivity troponin I (hsTnI) values and significant  changes across serial measurements may suggest ACS but many other  chronic and acute conditions are known to elevate hsTnI results.  Refer to the "Links" section for chest pain algorithms and additional  guidance. Performed at [facility], [address] [place], [place] [address]   ABO/Rh     Status: None   Collection Time: 11/21/19  8:53 PM  Result Value Ref Range   ABO/RH(D)      A NEG Performed at [facility], [address] [place], [place] [address]   Brain natriuretic peptide     Status: None   Collection Time: 11/21/19  8:53 PM  Result Value Ref Range   B Natriuretic Peptide 27.0 0.0 - 100.0 pg/mL    Comment: Performed at [facility], [address] [place], [place] [address]  Fibrin derivatives D-Dimer [facility] only)     Status: Abnormal   Collection Time: 11/21/19   8:53 PM  Result Value Ref Range   Fibrin derivatives D-dimer ([facility]) 722.84 (H) 0.00 - 499.00 ng/mL (FEU)    Comment: (NOTE) <> Exclusion of Venous Thromboembolism (VTE) - OUTPATIENT ONLY   (Emergency Department or [place])  0-499 ng/ml (FEU): With a low to intermediate pretest probability                      for VTE this test result excludes the diagnosis                      of VTE.   >499 ng/ml (FEU) : VTE not excluded; additional work up for VTE is                      required. <> Testing on Inpatients and Evaluation of Disseminated Intravascular   Coagulation (DIC) Reference Range:   0-499 ng/ml (FEU) Performed at [facility], [address] [place], [place] [address]   Ferritin     Status: None   Collection Time: 11/21/19  8:53 PM  Result Value Ref Range   Ferritin 282 11 - 307 ng/mL    Comment: Performed at [facility], [address] [place], [place] [address]  Lactate dehydrogenase     Status: Abnormal   Collection Time: 11/21/19  8:53 PM  Result Value Ref Range   LDH 283 (H) 98 - 192 U/L    Comment: Performed at [facility], [address] [place],  [address]  Procalcitonin     Status: None   Collection Time: 11/21/19  8:53 PM  Result Value Ref Range   Procalcitonin <0.10 ng/mL    Comment:        Interpretation: PCT (Procalcitonin) <= 0.5 ng/mL: Systemic infection (sepsis) is not likely. Local bacterial infection is possible. (NOTE)       Sepsis PCT Algorithm           Lower Respiratory Tract                                      Infection PCT Algorithm    ----------------------------     ----------------------------         PCT < 0.25 ng/mL                PCT < 0.10 ng/mL         Strongly encourage             Strongly discourage   discontinuation of antibiotics    initiation of antibiotics    ----------------------------     -----------------------------       PCT 0.25 - 0.50 ng/mL            PCT 0.10 - 0.25 ng/mL                OR       >80% decrease in PCT            Discourage initiation of                                            antibiotics      Encourage discontinuation           of antibiotics    ----------------------------     -----------------------------         PCT >= 0.50 ng/mL              PCT 0.26 - 0.50 ng/mL  AND        <80% decrease in PCT             Encourage initiation of                                             antibiotics       Encourage continuation           of antibiotics    ----------------------------     -----------------------------        PCT >= 0.50 ng/mL                  PCT > 0.50 ng/mL               AND         increase in PCT                  Strongly encourage                                      initiation of antibiotics    Strongly encourage escalation           of antibiotics                                     -----------------------------                                           PCT <= 0.25 ng/mL                                                 OR                                        > 80% decrease in PCT                                     Discontinue / Do not initiate                                             antibiotics Performed at [facility], [address] [place], [place] [address]    DG Chest 2 View  Result Date: 11/21/2019 CLINICAL DATA:  Cough, shortness of breath, COVID-19 positive EXAM: CHEST - 2 VIEW COMPARISON:  07/13/2007 FINDINGS: The heart size and mediastinal contours are within normal limits. Lung volumes are low. There are patchy bibasilar airspace opacities. No pleural effusion or pneumothorax. S-shaped scoliotic curvature. IMPRESSION: Patchy bibasilar airspace opacities suspicious for multifocal atypical/viral pneumonia. Electronically Signed   By: Davina Poke D.O.   On: 11/21/2019 13:27    Pending Labs Unresulted Labs (From admission,  onward)    Start     Ordered   11/22/19 0500  CBC with  Differential/Platelet  Daily,   STAT     11/21/19 2016   11/22/19 0500  C-reactive protein  Daily,   STAT     11/21/19 2016   11/22/19 0500  Comprehensive metabolic panel  Daily,   STAT     11/21/19 2016   11/22/19 0500  Fibrin derivatives D-Dimer ([place] only)  Daily,   STAT     11/21/19 2016   11/22/19 0500  Ferritin  Daily,   STAT     11/21/19 2016   11/21/19 2137  C-reactive protein  ONCE - STAT,   STAT     11/21/19 2137   11/21/19 2122  C-reactive protein  ONCE - STAT,   STAT     11/21/19 2122   11/21/19 2015  Culture, sputum-assessment  Once,   STAT     11/21/19 2016   11/21/19 2014  Influenza panel by PCR (type A & B)  Add-on,   AD     11/21/19 2016   11/21/19 2014  C-reactive protein  Once,   STAT     11/21/19 2016   11/21/19 2012  HIV Antibody (routine testing w rflx)  (HIV Antibody (Routine testing w reflex) panel)  Once,   STAT     11/21/19 2016   11/21/19 1944  Respiratory Panel by RT PCR (Flu A&B, Covid) - Nasopharyngeal Swab  (Tier 2 Respiratory Panel by RT PCR (Flu A&B, Covid) (TAT 2 hrs))  Once,   STAT    Question Answer Comment  Is this test for diagnosis or screening Diagnosis of ill patient   Symptomatic for COVID-19 as defined by CDC Yes   Date of Symptom Onset 11/16/2019   Hospitalized for COVID-19 Yes   Admitted to ICU for COVID-19 No   Previously tested for COVID-19 Yes   Resident in a congregate (group) care setting No   Employed in healthcare setting No   Pregnant No      11/21/19 1943   11/21/19 1251  Urinalysis, Complete w Microscopic  ONCE - STAT,   STAT     11/21/19 1251          Vitals/Pain Today's Vitals   11/21/19 1720 11/21/19 1720 11/21/19 1722 11/21/19 2000  BP: (!) 126/26     Pulse: 86     Resp: 20     Temp: 98.7 F (37.1 C)     TempSrc: Oral     SpO2: 96%     Weight:    76.2 kg  Height:    5' 7.01" (1.702 m)  PainSc: 0-No pain 0-No pain 0-No pain     Isolation Precautions Airborne and Contact  precautions  Medications Medications  albuterol (VENTOLIN HFA) 108 (90 Base) MCG/ACT inhaler 2 puff (has no administration in time range)  letrozole [facility]) tablet 2.5 mg (has no administration in time range)  Melatonin TABS 10 mg (has no administration in time range)  multivitamin with minerals tablet 1 tablet (has no administration in time range)  loratadine (CLARITIN) tablet 10 mg (has no administration in time range)  enoxaparin (LOVENOX) injection 40 mg (40 mg Subcutaneous Given 11/21/19 2238)  0.9 %  sodium chloride infusion ( Intravenous New Bag/Given 11/21/19 2210)  remdesivir 200 mg in sodium chloride 0.9% 250 mL IVPB (0 mg Intravenous Stopped 11/21/19 2249)    Followed by  remdesivir 100 mg in sodium chloride 0.9 % 100 mL IVPB (  has no administration in time range)  dexamethasone (DECADRON) injection 6 mg (has no administration in time range)  guaiFENesin-dextromethorphan (ROBITUSSIN DM) 100-10 MG/5ML syrup 10 mL (has no administration in time range)  chlorpheniramine-HYDROcodone (TUSSIONEX) 10-8 MG/5ML suspension 5 mL (has no administration in time range)  ascorbic acid (VITAMIN C) tablet 500 mg (500 mg Oral Given 11/21/19 2235)  zinc sulfate capsule 220 mg (220 mg Oral Given 11/21/19 2235)  famotidine (PEPCID) tablet 20 mg (20 mg Oral Given 11/21/19 2235)  acetaminophen (TYLENOL) tablet 650 mg (650 mg Oral Given 11/21/19 2058)  traZODone (DESYREL) tablet 25 mg (has no administration in time range)  magnesium hydroxide (MILK OF MAGNESIA) suspension 30 mL (has no administration in time range)  ondansetron (ZOFRAN) tablet 4 mg (has no administration in time range)    Or  ondansetron (ZOFRAN) injection 4 mg (has no administration in time range)  aspirin EC tablet 81 mg (has no administration in time range)  cholecalciferol (VITAMIN D3) tablet 1,000 Units (1,000 Units Oral Given 11/21/19 2235)  guaiFENesin (MUCINEX) 12 hr tablet 600 mg (600 mg Oral Given 11/21/19 2235)  acetaminophen  (TYLENOL) tablet 650 mg (650 mg Oral Given 11/21/19 1255)    Mobility walks Low fall risk   Focused Assessments Pulmonary Assessment Handoff:  Lung sounds:   O2 Device: Room Air        R Recommendations: See Admitting Provider Note  Report given to:   Additional Notes: COVID positive

## 2019-11-21 NOTE — ED Notes (Signed)
Pt not able to provide a urinalysis at this time.

## 2019-11-21 NOTE — ED Provider Notes (Signed)
[facility] Emergency Department Provider Note   ____________________________________________   First MD Initiated Contact with Patient 11/21/19 1811     (approximate)  I have reviewed the triage vital signs and the nursing notes.   HISTORY  Chief Complaint Fever, Cough, and Shortness of Breath    HPI Lacey Garcia is a 59 y.o. female with past medical history of melanoma and breast cancer presents to the ED complaining of shortness of breath.  Patient reports that she for start developing fevers 5 days ago along with a nonproductive cough.  Over the past 2 days she has felt increasingly short of breath with some pressure in the center of her chest.  Symptoms are constant but seem to be exacerbated with any exertion.  [facility] and was advised of a positive result earlier today.  She was evaluated at her PCPs office, where she was noted to be hypoxic on room air, subsequently referred to the ED for further evaluation.  She reports being given a dose of IM steroids at her PCPs office, states that her breathing is not too bad if she is resting in the stretcher on oxygen.        Past Medical History:  Diagnosis Date  . Breast cancer ([place]) 08/2017   right breast  . Cancer ([place])    melanoma  . Complication of anesthesia   . GERD (gastroesophageal reflux disease)    rare-no meds  . History of kidney stones 1991  . Melanoma ([place]) 1993   Melanoma x 2 times.   . Personal history of radiation therapy 2018   RIGHT lumpectomy  . PONV (postoperative nausea and vomiting)     Patient Active Problem List   Diagnosis Date Noted  . COVID-19 11/21/2019  . 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 BREAST BIOPSY Right 09/01/2017   AFFIRM Stereo/intraductal carcinoma in situ  . BREAST LUMPECTOMY Right 09/27/2017   intraductal cacinoma in situ with radiation  . coloscopy  2014  .  FRACTURE SURGERY    . HAND SURGERY     trigger finger release  . leg left rod and pins    . LITHOTRIPSY    . MELANOMA EXCISION     left leg and right leg  . PARTIAL MASTECTOMY WITH NEEDLE LOCALIZATION Right 09/27/2017   Procedure: PARTIAL MASTECTOMY WITH NEEDLE LOCALIZATION;  Surgeon: Leonie Green, MD;  Location: [facility] ORS;  Service: General;  Laterality: Right;  . SENTINEL NODE BIOPSY Right 09/27/2017   Procedure: SENTINEL NODE BIOPSY;  Surgeon: Leonie Green, MD;  Location: [facility] ORS;  Service: General;  Laterality: Right;    Prior to Admission medications   Medication Sig Start Date End Date Taking? Authorizing Provider  ascorbic acid (VITAMIN C) 500 MG tablet Take 1,000 mg by mouth daily.   Yes [provider]  Cholecalciferol (VITAMIN D-3) 5000 units TABS Take 5,000 Units by mouth daily.    Yes [provider]  Coenzyme Q10 100 MG capsule Take 100 mg by mouth daily.   Yes [provider]  famotidine (PEPCID) 20 MG tablet Take 20 mg by mouth daily as needed for heartburn or indigestion.   Yes [provider]  letrozole (FEMARA) 2.5 MG tablet Take 1 tablet (2.5 mg total) by mouth daily. 03/09/19  Yes Earlie Server, MD  Multiple Vitamin (MULTI-VITAMIN DAILY PO) Take 1 tablet by mouth daily.  Yes [provider]  zinc sulfate 220 (50 Zn) MG capsule Take 220 mg by mouth daily.   Yes [provider]    Allergies Sulfa antibiotics  Family History  Problem Relation Age of Onset  . Hypertension Mother   . Heart disease Mother   . Diabetes Father   . Hypertension Father   . Heart disease Father   . Breast cancer Neg Hx     Social History Social History   Tobacco Use  . Smoking status: Never Smoker  . Smokeless tobacco: Never Used  . Tobacco comment: no tobacco use  Substance Use Topics  . Alcohol use: No  . Drug use: No    Review of Systems  Constitutional: Positive for fever/chills Eyes: No visual  changes. ENT: No sore throat. Cardiovascular: Positive for chest pain. Respiratory: Positive for cough and shortness of breath. Gastrointestinal: No abdominal pain.  No nausea, no vomiting.  No diarrhea.  No constipation. Genitourinary: Negative for dysuria. Musculoskeletal: Negative for back pain. Skin: Negative for rash. Neurological: Negative for headaches, focal weakness or numbness.  ____________________________________________   PHYSICAL EXAM:  VITAL SIGNS: ED Triage Vitals  Enc Vitals Group     BP 11/21/19 1237 140/65     Pulse Rate 11/21/19 1237 100     Resp 11/21/19 1237 (!) 32     Temp 11/21/19 1237 (!) 102.2 F (39 C)     Temp Source 11/21/19 1237 Oral     SpO2 11/21/19 1237 90 %     Weight --      Height --      Head Circumference --      Peak Flow --      Pain Score 11/21/19 1249 0     Pain Loc --      Pain Edu? --      Excl. in [place]? --     Constitutional: Alert and oriented. Eyes: Conjunctivae are normal. Head: Atraumatic. Nose: No congestion/rhinnorhea. Mouth/Throat: Mucous membranes are moist. Neck: Normal ROM Cardiovascular: Normal rate, regular rhythm. Grossly normal heart sounds. Respiratory: Normal respiratory effort.  No retractions. Lungs CTAB. Gastrointestinal: Soft and nontender. No distention. Genitourinary: deferred Musculoskeletal: No lower extremity tenderness nor edema. Neurologic:  Normal speech and language. No gross focal neurologic deficits are appreciated. Skin:  Skin is warm, dry and intact. No rash noted. Psychiatric: Mood and affect are normal. Speech and behavior are normal.  ____________________________________________   LABS (all labs ordered are listed, but only abnormal results are displayed)  Labs Reviewed  BASIC METABOLIC PANEL - Abnormal; Notable for the following components:      Result Value   Glucose, Bld 120 (*)    Calcium 8.8 (*)    All other components within normal limits  CBC - Abnormal; Notable for the  following components:   WBC 3.2 (*)    Platelets 145 (*)    All other components within normal limits  FIBRIN DERIVATIVES D-DIMER ([facility] ONLY) - Abnormal; Notable for the following components:   Fibrin derivatives D-dimer ([facility]) 722.84 (*)    All other components within normal limits  LACTATE DEHYDROGENASE - Abnormal; Notable for the following components:   LDH 283 (*)    All other components within normal limits  RESPIRATORY PANEL BY RT PCR (FLU A&B, COVID)  EXPECTORATED SPUTUM ASSESSMENT W REFEX TO RESP CULTURE  BRAIN NATRIURETIC PEPTIDE  FERRITIN  PROCALCITONIN  URINALYSIS, COMPLETE (UACMP) WITH MICROSCOPIC  HIV ANTIBODY (ROUTINE TESTING W REFLEX)  INFLUENZA PANEL BY PCR (  TYPE A & B)  C-REACTIVE PROTEIN  CBC WITH DIFFERENTIAL/PLATELET  C-REACTIVE PROTEIN  COMPREHENSIVE METABOLIC PANEL  FIBRIN DERIVATIVES D-DIMER ([facility] ONLY)  FERRITIN  C-REACTIVE PROTEIN  C-REACTIVE PROTEIN  POC SARS CORONAVIRUS 2 AG -  ED  ABO/RH  TROPONIN I (HIGH SENSITIVITY)  TROPONIN I (HIGH SENSITIVITY)   ____________________________________________  EKG  ED ECG REPORT I, Blake Divine, the attending physician, personally viewed and interpreted this ECG.   Date: 11/21/2019  EKG Time: 12:38  Rate: 98  Rhythm: normal sinus rhythm  Axis: Normal  Intervals:none  ST&T Change: None   PROCEDURES  Procedure(s) performed (including Critical Care):  Procedures   ____________________________________________   INITIAL IMPRESSION / ASSESSMENT AND PLAN / ED COURSE       59 year old female presents to the ED with 5 days of fevers and cough, has felt increasingly short of breath over the past 2 days with recent positive test for COVID-19.  She arrives in no respiratory distress, was noted to have O2 sats of 88 to 90% on room air and subsequently placed on 2 L nasal cannula.  She is breathing comfortably on the 2 L at the time of my evaluation.  Chest x-ray appears consistent with COVID-19  pneumonia, do not suspect bacterial component and vital signs not consistent with sepsis.  She was given a dose of steroids prior to arrival, we will treat symptomatically with albuterol here in the ED.  Remainder of labs are reassuring, EKG without acute ischemic changes.  She will likely require admission given her oxygen requirement in the setting of COVID-19.  Troponin within normal limits, patient remained stable on 2 L nasal cannula.  Case was discussed with hospitalist, who accepts patient for admission.      ____________________________________________   FINAL CLINICAL IMPRESSION(S) / ED DIAGNOSES  Final diagnoses:  Pneumonia due to COVID-19 virus  Shortness of breath     ED Discharge Orders    None       Note:  This document was prepared using Dragon voice recognition software and may include unintentional dictation errors.   Blake Divine, MD 11/22/19 [phone]

## 2019-11-21 NOTE — ED Notes (Signed)
Patient eating a sandwich tray.

## 2019-11-21 NOTE — H&P (Addendum)
[facility] [place] NAME: Lacey Garcia    MR#:  [id number]  DATE OF BIRTH:  1961-09-22  DATE OF ADMISSION:  11/21/2019  PRIMARY CARE PHYSICIAN: Idelle Crouch, MD   REQUESTING/REFERRING PHYSICIAN: Blake Divine, MD CHIEF COMPLAINT:   Chief Complaint  Patient presents with  . Fever  . Cough  . Shortness of Breath    HISTORY OF PRESENT ILLNESS:  Lacey Garcia  is a 59 y.o. Caucasian female with a known history of GERD, urolithiasis, right breast cancer status post right lumpectomy and radiotherapy, presented to the emergency room with acute onset of worsening dyspnea and dry cough with no wheezing, since Monday.  The patient started having symptoms on Friday with extreme fatigue and then developed a fever that was up to 102 over the weekend.  She denies any body aches.  She admitted to distorted taste but mostly metallic 1.  She has been having significantly diminished appetite and stated that she had to force food.  She had diarrhea couple of times over the last few days.  No nausea or vomiting.  She admits to some chest tightness without palpitations.  No leg pain or edema recent travels or surgeries.  She denies any significant rhinorrhea or sore throat.  The patient tested positive [facility] 2 days ago.  Upon presentation to the emergency room, temperature was 102.2 with respiratory to 32 and pulse ox of 90% on room air.  It has been as low as 88% at her PCPs office and here on 2 L O2 by nasal cannula it has been 98%.  Labs revealed borderline potassium of 3.5 and a calcium of 11 with high-sensitivity troponin I of 4 mild leukopenia of 3.2.  EKG showed no sinus rhythm with rate of 98 with acute elevation inferiorly and in V1.  Two-view chest x-ray revealed low lung volumes with patchy bibasal airspace opacities suspicious for multifocal atypical/viral pneumonia.  COVID-19 rapid antigen test came back negative and its PCR test is currently  pending.  The patient received 60 mg IM Kenalog at her primary care physician's office and was given a prescription for Deltasone and p.o. Zithromax before sending her to the emergency room.  She was ordered Tylenol 650 mg p.o. here as well as albuterol MDI.  She will be admitted to medical monitored isolation bed for further evaluation and management. PAST MEDICAL HISTORY:   Past Medical History:  Diagnosis Date  . Breast cancer ([place]) 08/2017   right breast  . Cancer ([place])    melanoma  . Complication of anesthesia   . GERD (gastroesophageal reflux disease)    rare-no meds  . History of kidney stones 1991  . Melanoma ([place]) 1993   Melanoma x 2 times.   . Personal history of radiation therapy 2018   RIGHT lumpectomy  . PONV (postoperative nausea and vomiting)     PAST SURGICAL HISTORY:   Past Surgical History:  Procedure Laterality Date  . BREAST BIOPSY Right 09/01/2017   AFFIRM Stereo/intraductal carcinoma in situ  . BREAST LUMPECTOMY Right 09/27/2017   intraductal cacinoma in situ with radiation  . coloscopy  2014  . FRACTURE SURGERY    . HAND SURGERY     trigger finger release  . leg left rod and pins    . LITHOTRIPSY    . MELANOMA EXCISION     left leg and right leg  . PARTIAL MASTECTOMY WITH NEEDLE LOCALIZATION Right 09/27/2017   Procedure: PARTIAL  MASTECTOMY WITH NEEDLE LOCALIZATION;  Surgeon: Leonie Green, MD;  Location: [facility] ORS;  Service: General;  Laterality: Right;  . SENTINEL NODE BIOPSY Right 09/27/2017   Procedure: SENTINEL NODE BIOPSY;  Surgeon: Leonie Green, MD;  Location: [facility] ORS;  Service: General;  Laterality: Right;    SOCIAL HISTORY:   Social History   Tobacco Use  . Smoking status: Never Smoker  . Smokeless tobacco: Never Used  . Tobacco comment: no tobacco use  Substance Use Topics  . Alcohol use: No    FAMILY HISTORY:   Family History  Problem Relation Age of Onset  . Hypertension Mother   . Heart disease Mother   .  Diabetes Father   . Hypertension Father   . Heart disease Father   . Breast cancer Neg Hx     DRUG ALLERGIES:   Allergies  Allergen Reactions  . Sulfa Antibiotics Hives and Rash    REVIEW OF SYSTEMS:   ROS As per history of present illness. All pertinent systems were reviewed above. Constitutional,  HEENT, cardiovascular, respiratory, GI, GU, musculoskeletal, neuro, psychiatric, endocrine,  integumentary and hematologic systems were reviewed and are otherwise  negative/unremarkable except for positive findings mentioned above in the HPI.   MEDICATIONS AT HOME:   Prior to Admission medications   Medication Sig Start Date End Date Taking? Authorizing Provider  cetirizine (ZYRTEC) 10 MG tablet Take 10 mg by mouth daily.    [provider]  Cholecalciferol (VITAMIN D-3) 5000 units TABS Take 1 tablet daily by mouth.    [provider]  CoenzymeQ10-Isoleucine-Glycine (CO Q-10) 100-50-25 MG TB24 Co Q-10    [provider]  ibuprofen (ADVIL,MOTRIN) 200 MG tablet Take 200 mg by mouth every 6 (six) hours as needed.    [provider]  letrozole (FEMARA) 2.5 MG tablet Take 1 tablet (2.5 mg total) by mouth daily. 03/09/19   Earlie Server, MD  Melatonin 10-10 MG TBCR Take 1 capsule by mouth every evening.    [provider]  Multiple Vitamin (MULTI-VITAMIN DAILY PO) Multi Vitamin    [provider]      VITAL SIGNS:  Blood pressure (!) 126/26, pulse 86, temperature 98.7 F (37.1 C), temperature source Oral, resp. rate 20, SpO2 96 %.  PHYSICAL EXAMINATION:  Physical Exam  GENERAL:  59 y.o.-year-old Caucasian female patient lying in the bed in mild respiratory distress with conversational dyspnea and interrupting cough EYES: Pupils equal, round, reactive to light and accommodation. No scleral icterus. Extraocular muscles intact.  HEENT: Head atraumatic, normocephalic. Oropharynx and nasopharynx clear.  NECK:  Supple, no jugular venous  distention. No thyroid enlargement, no tenderness.  LUNGS: Diminished bibasal breath sounds with bibasal crackles. CARDIOVASCULAR: Regular rate and rhythm, S1, S2 normal. No murmurs, rubs, or gallops.  ABDOMEN: Soft, nondistended, nontender. Bowel sounds present. No organomegaly or mass.  EXTREMITIES: No pedal edema, cyanosis, or clubbing.  NEUROLOGIC: Cranial nerves II through XII are intact. Muscle strength 5/5 in all extremities. Sensation intact. Gait not checked.  PSYCHIATRIC: The patient is alert and oriented x 3.  Normal affect and good eye contact. SKIN: No obvious rash, lesion, or ulcer.   LABORATORY PANEL:   CBC Recent Labs  Lab 11/21/19 1252  WBC 3.2*  HGB 12.7  HCT 39.2  PLT 145*   ------------------------------------------------------------------------------------------------------------------  Chemistries  Recent Labs  Lab 11/21/19 1252  NA 141  K 3.5  CL 101  CO2 29  GLUCOSE 120*  BUN 12  CREATININE 0.63  CALCIUM 8.8*   ------------------------------------------------------------------------------------------------------------------  Cardiac Enzymes No results for input(s): TROPONINI in the last 168 hours. ------------------------------------------------------------------------------------------------------------------  RADIOLOGY:  DG Chest 2 View  Result Date: 11/21/2019 CLINICAL DATA:  Cough, shortness of breath, COVID-19 positive EXAM: CHEST - 2 VIEW COMPARISON:  07/13/2007 FINDINGS: The heart size and mediastinal contours are within normal limits. Lung volumes are low. There are patchy bibasilar airspace opacities. No pleural effusion or pneumothorax. S-shaped scoliotic curvature. IMPRESSION: Patchy bibasilar airspace opacities suspicious for multifocal atypical/viral pneumonia. Electronically Signed   By: Davina Poke D.O.   On: 11/21/2019 13:27      IMPRESSION AND PLAN:   1.  Acute hypoxemic respiratory failure secondary to COVID-19. -The  patient will be admitted to a medically monitored isolation bed. -O2 protocol will be followed to keep O2 saturation above 93.   2.  Multifocal pneumonia secondary to COVID-19. -The patient will be admitted to an isolation monitored bed with droplet and contact precautions. -Given multifocal pneumonia we will empirically place the patient on IV Rocephin and Zithromax for possible bacterial superinfection only with elevated Procalcitonin. -The patient will be placed on scheduled Mucinex and as needed Tussionex. -We will avoid nebulization as much as we can, give bronchodilator MDI if needed, and with deterioration of oxygenation try to avoid BiPAP/CPAP if possible.    -Will obtain sputum Gram stain culture and sensitivity and follow blood cultures. -O2 protocol will be followed. -We will follow CRP, ferritin, LDH and D-dimer. -Will follow manual differential for ANC/ALC ratio as well as follow troponin I and daily CBC with manual differential and CMP. - Will place the patient on IV Remdisivir and IV steroid therapy with Decadron with elevated inflammatory markers. - I discussed convalescent plasma benefits and risks with the patient who was agreeable to proceed with it. -The patient will be placed on vitamin D3, vitamin C, zinc sulfate, p.o. Pepcid and aspirin. -The patient is also interested in Actemra should her CRP comes back above 7.  3.  Chest pain. -This is likely pleuritic secondary to her pneumonia or musculoskeletal from excessive cough. -We will place on aspirin as mentioned above and follow serial troponin I's.  4.  History right breast cancer status post right lumpectomy and radiotherapy. -We will continue Femara.  5.  DVT prophylaxis. -Subtenons Lovenox    All the records are reviewed and case discussed with ED provider. The plan of care was discussed in details with the patient (and family). I answered all questions. The patient agreed to proceed with the above mentioned  plan. Further management will depend upon hospital course.   CODE STATUS: Full code  TOTAL TIME TAKING CARE OF THIS PATIENT: 55 minutes.    Christel Mormon M.D on 11/21/2019 at 8:17 PM  [facility]   From 7 PM-7 AM, contact night-coverage [email or web address]  CC: Primary care physician; Idelle Crouch, MD   Note: This dictation was prepared with Dragon dictation along with smaller phrase technology. Any transcriptional errors that result from this process are unintentional.

## 2019-11-22 ENCOUNTER — Inpatient Hospital Stay: Payer: PRIVATE HEALTH INSURANCE

## 2019-11-22 ENCOUNTER — Encounter: Payer: Self-pay | Admitting: Family Medicine

## 2019-11-22 DIAGNOSIS — J9601 Acute respiratory failure with hypoxia: Secondary | ICD-10-CM | POA: Diagnosis present

## 2019-11-22 LAB — COMPREHENSIVE METABOLIC PANEL
ALT: 49 U/L — ABNORMAL HIGH (ref 0–44)
AST: 52 U/L — ABNORMAL HIGH (ref 15–41)
Albumin: 3.3 g/dL — ABNORMAL LOW (ref 3.5–5.0)
Alkaline Phosphatase: 63 U/L (ref 38–126)
Anion gap: 11 (ref 5–15)
BUN: 14 mg/dL (ref 6–20)
CO2: 26 mmol/L (ref 22–32)
Calcium: 8.3 mg/dL — ABNORMAL LOW (ref 8.9–10.3)
Chloride: 105 mmol/L (ref 98–111)
Creatinine, Ser: 0.55 mg/dL (ref 0.44–1.00)
GFR calc Af Amer: >60 mL/min (ref >60)
GFR calc non Af Amer: >60 mL/min (ref >60)
Glucose, Bld: 121 mg/dL — ABNORMAL HIGH (ref 70–99)
Potassium: 3.5 mmol/L (ref 3.5–5.1)
Sodium: 142 mmol/L (ref 135–145)
Total Bilirubin: 0.6 mg/dL (ref 0.3–1.2)
Total Protein: 6.4 g/dL — ABNORMAL LOW (ref 6.5–8.1)

## 2019-11-22 LAB — CBC WITH DIFFERENTIAL/PLATELET
Abs Immature Granulocytes: 0.02 10*3/uL (ref 0.00–0.07)
Basophils Absolute: 0 10*3/uL (ref 0.0–0.1)
Basophils Relative: 0 %
Eosinophils Absolute: 0 10*3/uL (ref 0.0–0.5)
Eosinophils Relative: 0 %
HCT: 34.6 % — ABNORMAL LOW (ref 36.0–46.0)
Hemoglobin: 11.3 g/dL — ABNORMAL LOW (ref 12.0–15.0)
Immature Granulocytes: 1 %
Lymphocytes Relative: 27 %
Lymphs Abs: 1 10*3/uL (ref 0.7–4.0)
MCH: 30.7 pg (ref 26.0–34.0)
MCHC: 32.7 g/dL (ref 30.0–36.0)
MCV: 94 fL (ref 80.0–100.0)
Monocytes Absolute: 0.2 10*3/uL (ref 0.1–1.0)
Monocytes Relative: 5 %
Neutro Abs: 2.4 10*3/uL (ref 1.7–7.7)
Neutrophils Relative %: 67 %
Platelets: 137 10*3/uL — ABNORMAL LOW (ref 150–400)
RBC: 3.68 MIL/uL — ABNORMAL LOW (ref 3.87–5.11)
RDW: 13.1 % (ref 11.5–15.5)
Smear Review: NORMAL
WBC: 3.6 10*3/uL — ABNORMAL LOW (ref 4.0–10.5)
nRBC: 0 % (ref 0.0–0.2)

## 2019-11-22 LAB — C-REACTIVE PROTEIN
CRP: 4 mg/dL — ABNORMAL HIGH (ref <1.0)
CRP: 4.1 mg/dL — ABNORMAL HIGH (ref <1.0)

## 2019-11-22 LAB — HIV ANTIBODY (ROUTINE TESTING W REFLEX): HIV Screen 4th Generation wRfx: NONREACTIVE

## 2019-11-22 LAB — RESPIRATORY PANEL BY RT PCR (FLU A&B, COVID)
Influenza A by PCR: NEGATIVE
Influenza B by PCR: NEGATIVE
SARS Coronavirus 2 by RT PCR: POSITIVE — AB

## 2019-11-22 LAB — FIBRIN DERIVATIVES D-DIMER (ARMC ONLY): Fibrin derivatives D-dimer (ARMC): 653.77 ng/mL (FEU) — ABNORMAL HIGH (ref 0.00–499.00)

## 2019-11-22 LAB — FERRITIN: Ferritin: 281 ng/mL (ref 11–307)

## 2019-11-22 MED ORDER — IOHEXOL 350 MG/ML SOLN
100.0000 mL | Freq: Once | INTRAVENOUS | Status: AC | PRN
  Administered 2019-11-22: 100 mL via INTRAVENOUS

## 2019-11-22 NOTE — Progress Notes (Signed)
SATURATION QUALIFICATIONS: (This note is used to comply with regulatory documentation for home oxygen)  Patient Saturations on Room Air at Rest = 88%  Patient Saturations on Room Air while Ambulating = 82%  Patient Saturations on 2 Liters of oxygen while Ambulating = 92%  Please briefly explain why patient needs home oxygen: 

## 2019-11-22 NOTE — Progress Notes (Signed)
PROGRESS NOTE    Lacey Garcia  [id number] DOB: [birth year] DOA: 11/21/2019  PCP: Idelle Crouch, MD    LOS - 1   Brief Narrative:  59 y.o. female with a history of GERD, urolithiasis, right breast cancer status post right lumpectomy and radiotherapy, presented to the ED with progressive dyspnea and dry cough for two days, having [facility].  Associated fevers up to 102 F, poor appetite, diarrhea.  In the ED, febrile 102.2 F, tachypneic RR 32, mildly hypoxic 88-90% on room air improved to 98% on 2 L/min oxygen.  Notable labs included K 3.5, Ca 11, normal troponin, leukopenia WBC 3.2k.  Chest xray showed low lung volumes with patchy bibasilar airspace opacities consistent with multifocal pneumonia.  Rapid COVID-19 antigen test negative, but PCR later returned positive.  She is admitted for management of acute hypoxic respiratory failure secondary to COVID-10 pneumonia.  Subjective 1/14: Patient seen today and reports feeling a little better.  Denies fever/chills.  Continues with dry cough.  No acute events reported.  Discussed results of CT scan, no PE, bilateral pneumonia.  She reports return of her appetite yesterday evening, ate well today also after no appetite at home for days.  Assessment & Plan:   Principal Problem:   Acute respiratory failure with hypoxia (HCC) Active Problems:   COVID-19   Acute hypoxemic respiratory failure due to COVID-19 secondary to Multifocal Pneumonia due to COVID-19 virus Presented with progressive shortness of breath, hypoxia, cough, with chest x-ray showing bibasilar airspace opacities consistent with multifocal pneumonia and Covid test positive.  CRP 4.0->4.1.  CTA chest negative for PE, bilateral ground glass opacities.  --[facility] --Oxygen requirements: 2 L/min --Remdesivir: Started on 11/21/19 PM --Steroids: Started on  11/22/19 --Vitamin C and Zinc: Per  protocol --Antibiotics: Procalcitonin negative, antibiotics discontinued --Diuretics: No history of CHF, clinically euvolemic --maintain euvolemia to net-neg fluid balance --Actemra: not indicated at this time, patient agreeable if CRP rising or increasing O2 needs --maintain airborne and contact precautions --supplemental oxygen, maintain o2 sat > 90% --albuterol inhaler --Mucinex tabs BID, Robitussin DM PRN cough --encourage patient to maintain awake prone positive for 16+hours a day as possible, minimum 2-3 hours at night --incentive spirometer, encourage frequent use --patient interested in convalescent plasma  --follow up sputum, blood cultures --monitor inflammatory markers daily to guide therapy  Chest Pain, pleuritic secondary to above, present on admission - improved.  Troponin trended and negative.  Continue aspirin.  History right breast cancer status post right lumpectomy and radiotherapy. --continue Femara   DVT prophylaxis: Lovenox   Code Status: Full Code  Family Communication: Husband, Leafy Sanon, updated by phone this evening.   Disposition Plan:  Expect discharge home in 2-3 days if weaned off oxygen.  Remdesivir infusion clinic discussed with patient if she improves quickly and she is agreeable.   Consultants:   None  Procedures:   none  Antimicrobials:   Rocephin, azithromycin 1/13-1/14   Objective: Vitals:   11/22/19 0021 11/22/19 0513 11/22/19 1457 11/22/19 1949  BP: (!) 120/57 (!) 125/57 114/61 (!) 111/59  Pulse: 81 91 75 79  Resp: 18 18  20   Temp: 99.4 F (37.4 C) 100.3 F (37.9 C) 99 F (37.2 C) 98.1 F (36.7 C)  TempSrc: Oral Oral Oral Oral  SpO2: 94% 96% 94% 91%  Weight:      Height:        Intake/Output Summary (Last  24 hours) at 11/22/2019 2053 Last data filed at 11/22/2019 1952 Gross per 24 hour  Intake 1023.9 ml  Output 1400 ml  Net -376.1 ml   Filed Weights   11/21/19 2000  Weight: 76.2 kg    Examination:  General  exam: awake, alert, no acute distress HEENT: clear conjunctiva, anicteric sclera, moist mucus membranes, hearing grossly normal  Respiratory system: diminished at bases bilaterally, no wheezes or rhonchi, normal respiratory effort, no conversational dyspnea. Cardiovascular system: normal S1/S2, RRR, no JVD, murmurs, rubs, gallops, no pedal edema.   Central nervous system: alert and oriented x4. no gross focal neurologic deficits, normal speech Extremities: moves all, no cyanosis, normal tone Skin: dry, intact, normal temperature Psychiatry: normal mood, congruent affect, judgement and insight appear normal   Data Reviewed: I have personally reviewed following labs and imaging studies  CBC: Recent Labs  Lab 11/21/19 1252 11/22/19 0342  WBC 3.2* 3.6*  NEUTROABS  --  2.4  HGB 12.7 11.3*  HCT 39.2 34.6*  MCV 92.7 94.0  PLT 145* 0000000*   Basic Metabolic Panel: Recent Labs  Lab 11/21/19 1252 11/22/19 0342  NA 141 142  K 3.5 3.5  CL 101 105  CO2 29 26  GLUCOSE 120* 121*  BUN 12 14  CREATININE 0.63 0.55  CALCIUM 8.8* 8.3*   GFR: Estimated Creatinine Clearance: 82.6 mL/min (by C-G formula based on SCr of 0.55 mg/dL). Liver Function Tests: Recent Labs  Lab 11/22/19 0342  AST 52*  ALT 49*  ALKPHOS 63  BILITOT 0.6  PROT 6.4*  ALBUMIN 3.3*   No results for input(s): LIPASE, AMYLASE in the last 168 hours. No results for input(s): AMMONIA in the last 168 hours. Coagulation Profile: No results for input(s): INR, PROTIME in the last 168 hours. Cardiac Enzymes: No results for input(s): CKTOTAL, CKMB, CKMBINDEX, TROPONINI in the last 168 hours. BNP (last 3 results) No results for input(s): PROBNP in the last 8760 hours. HbA1C: No results for input(s): HGBA1C in the last 72 hours. CBG: No results for input(s): GLUCAP in the last 168 hours. Lipid Profile: No results for input(s): CHOL, HDL, LDLCALC, TRIG, CHOLHDL, LDLDIRECT in the last 72 hours. Thyroid Function Tests: No  results for input(s): TSH, T4TOTAL, FREET4, T3FREE, THYROIDAB in the last 72 hours. Anemia Panel: Recent Labs    11/21/19 2053 11/22/19 0342  FERRITIN 282 281   Sepsis Labs: Recent Labs  Lab 11/21/19 2053  PROCALCITON <0.10    Recent Results (from the past 240 hour(s))  Respiratory Panel by RT PCR (Flu A&B, Covid) - Nasopharyngeal Swab     Status: Abnormal   Collection Time: 11/21/19  8:53 PM   Specimen: Nasopharyngeal Swab  Result Value Ref Range Status   SARS Coronavirus 2 by RT PCR POSITIVE (A) NEGATIVE Final    Comment: RESULT CALLED TO, READ BACK BY AND VERIFIED WITH: KRUSTIN JOYNER ON 11/22/19 AT 0151 [place] (NOTE) SARS-CoV-2 target nucleic acids are DETECTED. SARS-CoV-2 RNA is generally detectable in upper respiratory specimens  during the acute phase of infection. Positive results are indicative of the presence of the identified virus, but do not rule out bacterial infection or co-infection with other pathogens not detected by the test. Clinical correlation with patient history and other diagnostic information is necessary to determine patient infection status. The expected result is Negative. Fact Sheet for Patients:  PinkCheek.be Fact Sheet for Healthcare Providers: GravelBags.it This test is not yet approved or cleared by the [place] and  has been authorized  for detection and/or diagnosis of SARS-CoV-2 by FDA under an Emergency Use Authorization (EUA).  This EUA will remain in effect (meaning this test can be used)  for the duration of  the COVID-19 declaration under Section 564(b)(1) of the Act, 21 U.S.C. section 360bbb-3(b)(1), unless the authorization is terminated or revoked sooner.    Influenza A by PCR NEGATIVE NEGATIVE Final   Influenza B by PCR NEGATIVE NEGATIVE Final    Comment: (NOTE) The Xpert Xpress SARS-CoV-2/FLU/RSV assay is intended as an aid in  the diagnosis of influenza from  Nasopharyngeal swab specimens and  should not be used as a sole basis for treatment. Nasal washings and  aspirates are unacceptable for Xpert Xpress SARS-CoV-2/FLU/RSV  testing. Fact Sheet for Patients: PinkCheek.be Fact Sheet for Healthcare Providers: GravelBags.it This test is not yet approved or cleared by the [place] FDA and  has been authorized for detection and/or diagnosis of SARS-CoV-2 by  FDA under an Emergency Use Authorization (EUA). This EUA will remain  in effect (meaning this test can be used) for the duration of the  Covid-19 declaration under Section 564(b)(1) of the Act, 21  U.S.C. section 360bbb-3(b)(1), unless the authorization is  terminated or revoked. Performed at [facility], [address] [place], [place] [address]          Radiology Studies: DG Chest 2 View  Result Date: 11/21/2019 CLINICAL DATA:  Cough, shortness of breath, COVID-19 positive EXAM: CHEST - 2 VIEW COMPARISON:  07/13/2007 FINDINGS: The heart size and mediastinal contours are within normal limits. Lung volumes are low. There are patchy bibasilar airspace opacities. No pleural effusion or pneumothorax. S-shaped scoliotic curvature. IMPRESSION: Patchy bibasilar airspace opacities suspicious for multifocal atypical/viral pneumonia. Electronically Signed   By: Davina Poke D.O.   On: 11/21/2019 13:27   CT ANGIO CHEST PE W OR WO CONTRAST  Result Date: 11/22/2019 CLINICAL DATA:  Shortness of breath Elevated D-dimer, COVID-19, rule out PE. History of RIGHT breast cancer. EXAM: CT ANGIOGRAPHY CHEST WITH CONTRAST TECHNIQUE: Multidetector CT imaging of the chest was performed using the standard protocol during bolus administration of intravenous contrast. Multiplanar CT image reconstructions and MIPs were obtained to evaluate the vascular anatomy. CONTRAST:  130mL OMNIPAQUE IOHEXOL 350 MG/ML SOLN COMPARISON:  Chest x-ray  11/21/2019 FINDINGS: Cardiovascular: Pulmonary arteries are well opacified. No acute pulmonary embolus. Heart size is normal. No pericardial effusion. No significant coronary artery calcifications. No significant atherosclerosis of the thoracic aorta. Mediastinum/Nodes: Mildly thickened esophageal wall raising the question of esophagitis. No mass identified. Small hiatal hernia present. No significant mediastinal or axillary adenopathy. Is difficult to exclude hilar adenopathy given the presence of parenchymal opacities in the lungs. The visualized portion of the thyroid gland has a normal appearance. Lungs/Pleura: There are multifocal ground-glass opacities and dependent consolidation bilat. Air bronchograms are seen primarily at the bases. No pleural effusion. Airways are patent. Upper Abdomen: There is small hernia of the gastric fundus through the MEDIAL aspect of the LEFT hemidiaphragm, discrete from the diaphragmatic hiatus for the esophagus. Stomach is filled with debris. Intrarenal calculi identified in the RIGHT kidney. Musculoskeletal: Scoliosis and degenerative changes of the thoracolumbar spine. Review of the MIP images confirms the above findings. IMPRESSION: 1. Technically adequate exam showing no acute pulmonary embolus. 2. Small hiatal hernia. 3. Mildly thickened esophageal wall raising the question of esophagitis. 4. Small hernia of the gastric fundus through the MEDIAL aspect of the LEFT hemidiaphragm, discrete from the diaphragmatic hiatus for the esophagus. 5. Intrarenal  calculi in the RIGHT kidney. 6. Scoliosis and degenerative changes. 7. Aortic Atherosclerosis (ICD10-I70.0). Electronically Signed   By: Nolon Nations M.D.   On: 11/22/2019 13:38        Scheduled Meds: . vitamin C  500 mg Oral Daily  . aspirin EC  81 mg Oral Daily  . cholecalciferol  1,000 Units Oral Daily  . dexamethasone (DECADRON) injection  6 mg Intravenous Q24H  . enoxaparin (LOVENOX) injection  40 mg  Subcutaneous Q24H  . famotidine  20 mg Oral BID  . guaiFENesin  600 mg Oral BID  . letrozole  2.5 mg Oral Daily  . loratadine  10 mg Oral Daily  . Melatonin  2 tablet Oral QHS  . multivitamin with minerals  1 tablet Oral Daily  . zinc sulfate  220 mg Oral Daily   Continuous Infusions: . sodium chloride Stopped (11/22/19 1519)  . remdesivir 100 mg in NS 100 mL Stopped (11/22/19 1003)     LOS: 1 day    Time spent: 32 minutes    Ezekiel Slocumb, DO [facility]   If 7PM-7AM, please contact night-coverage [email or web address] Password [email or web address] 11/22/2019, 8:53 PM

## 2019-11-23 LAB — CBC WITH DIFFERENTIAL/PLATELET
Abs Immature Granulocytes: 0.01 10*3/uL (ref 0.00–0.07)
Basophils Absolute: 0 10*3/uL (ref 0.0–0.1)
Basophils Relative: 0 %
Eosinophils Absolute: 0 10*3/uL (ref 0.0–0.5)
Eosinophils Relative: 0 %
HCT: 35.6 % — ABNORMAL LOW (ref 36.0–46.0)
Hemoglobin: 11.5 g/dL — ABNORMAL LOW (ref 12.0–15.0)
Immature Granulocytes: 0 %
Lymphocytes Relative: 26 %
Lymphs Abs: 0.7 10*3/uL (ref 0.7–4.0)
MCH: 30 pg (ref 26.0–34.0)
MCHC: 32.3 g/dL (ref 30.0–36.0)
MCV: 93 fL (ref 80.0–100.0)
Monocytes Absolute: 0.3 10*3/uL (ref 0.1–1.0)
Monocytes Relative: 11 %
Neutro Abs: 1.7 10*3/uL (ref 1.7–7.7)
Neutrophils Relative %: 63 %
Platelets: 182 10*3/uL (ref 150–400)
RBC: 3.83 MIL/uL — ABNORMAL LOW (ref 3.87–5.11)
RDW: 13.2 % (ref 11.5–15.5)
Smear Review: NORMAL
WBC: 2.7 10*3/uL — ABNORMAL LOW (ref 4.0–10.5)
nRBC: 0 % (ref 0.0–0.2)

## 2019-11-23 LAB — FERRITIN: Ferritin: 318 ng/mL — ABNORMAL HIGH (ref 11–307)

## 2019-11-23 LAB — COMPREHENSIVE METABOLIC PANEL
ALT: 42 U/L (ref 0–44)
AST: 27 U/L (ref 15–41)
Albumin: 3.2 g/dL — ABNORMAL LOW (ref 3.5–5.0)
Alkaline Phosphatase: 64 U/L (ref 38–126)
Anion gap: 9 (ref 5–15)
BUN: 17 mg/dL (ref 6–20)
CO2: 27 mmol/L (ref 22–32)
Calcium: 8.9 mg/dL (ref 8.9–10.3)
Chloride: 108 mmol/L (ref 98–111)
Creatinine, Ser: 0.57 mg/dL (ref 0.44–1.00)
GFR calc Af Amer: >60 mL/min (ref >60)
GFR calc non Af Amer: >60 mL/min (ref >60)
Glucose, Bld: 128 mg/dL — ABNORMAL HIGH (ref 70–99)
Potassium: 3.7 mmol/L (ref 3.5–5.1)
Sodium: 144 mmol/L (ref 135–145)
Total Bilirubin: 0.5 mg/dL (ref 0.3–1.2)
Total Protein: 6.5 g/dL (ref 6.5–8.1)

## 2019-11-23 LAB — C-REACTIVE PROTEIN: CRP: 3.8 mg/dL — ABNORMAL HIGH (ref <1.0)

## 2019-11-23 LAB — FIBRIN DERIVATIVES D-DIMER (ARMC ONLY): Fibrin derivatives D-dimer (ARMC): 686.8 ng/mL (FEU) — ABNORMAL HIGH (ref 0.00–499.00)

## 2019-11-23 MED ORDER — ALBUTEROL SULFATE HFA 108 (90 BASE) MCG/ACT IN AERS
2.0000 | INHALATION_SPRAY | Freq: Four times a day (QID) | RESPIRATORY_TRACT | Status: DC
  Administered 2019-11-23 – 2019-11-25 (×7): 2 via RESPIRATORY_TRACT
  Filled 2019-11-23: qty 6.7

## 2019-11-23 NOTE — Progress Notes (Signed)
PROGRESS NOTE    Lacey Garcia  [id number] DOB: 1961/10/21 DOA: 11/21/2019  PCP: Idelle Crouch, MD    LOS - 2   Brief Narrative:  59 y.o.femalewith a history of GERD, urolithiasis, right breast cancer status post right lumpectomy and radiotherapy, presented to the ED with progressive dyspnea and dry cough for two days, having had a positive COVID-19 test [facility] two days prior.  Associated fevers up to 102 F, poor appetite, diarrhea.  In the ED, febrile 102.2 F, tachypneic RR 32, mildly hypoxic 88-90% on room air improved to 98% on 2 L/min oxygen.  Notable labs included K 3.5, Ca 11, normal troponin, leukopenia WBC 3.2k.  Chest xray showed low lung volumes with patchy bibasilar airspace opacities consistent with multifocal pneumonia.  Rapid COVID-19 antigen test negative, but PCR later returned positive.  She is admitted for management of acute hypoxic respiratory failure secondary to COVID-10 pneumonia.  1/14 was on 2 L/min with spO2 96%, CTA negative for PE but showed b/l GG opacities. 1/15 now on 3 L/min with spO2 92%  Subjective 1/15: Patient seen this morning at bedside.  Reports she did not sleep well overnight.  Tried to sleep on her side but could not get comfortable.  Reports continues to cough intermittently productive.  No fevers or chills overnight or this morning.  Reports her eyes have been irritated and watery.  No acute events reported.  Assessment & Plan:   Principal Problem:   Acute respiratory failure with hypoxia (HCC) Active Problems:   COVID-19   Acute hypoxemic respiratory failure due to COVID-19 secondary to Multifocal Pneumonia due to COVID-19 virus Presented with progressive shortness of breath, hypoxia, cough, with chest x-ray showing bibasilar airspace opacities consistent with multifocal pneumonia and Covid test positive.  CRP 4.0->4.1.  CTA chest negative for PE, bilateral ground glass opacities.  --[id number] [facility] --Oxygen  requirements:2 L/min --Remdesivir: Started on1/13/21 PM --Steroids: Started on 11/22/19 --Vitamin C and Zinc:Per protocol --Antibiotics:Procalcitonin negative, antibiotics discontinued --Diuretics:No history of CHF, clinically euvolemic --maintain euvolemia to net-neg fluid balance --Actemra: not indicated at this time, patient agreeable if CRP rising or increasing O2 needs --maintain airborne and contact precautions --supplemental oxygen, maintain o2 sat > 90% --albuterol inhaler scheduled q6h --Mucinex tabs BID, Robitussin DM PRN cough --encourage patient to maintain awake prone positive for 16+hours a day as possible, minimum 2-3 hours at night --incentive spirometer, flutter valve --patient interested in convalescent plasma -discussed and will see if we can give  --follow up sputum, blood cultures --monitor inflammatory markers daily to guide therapy  Chest Pain, pleuritic secondary to above, present on admission - improved.  Troponin trended and negative.  Continue aspirin.  History right breast cancer status post right lumpectomy and radiotherapy. --continue Femara   DVT prophylaxis: Lovenox   Code Status: Full Code  Family Communication: None at bedside Disposition Plan: Expect discharge home pending further improvement and weaning of oxygen.  Patient would be a good candidate for remdesivir infusion clinic if she improves prior to completing 5 days.   Consultants:   None  Procedures:   None  Antimicrobials:   Rocephin, azithromycin 1/13-1/14   Objective: Vitals:   11/22/19 1457 11/22/19 1949 11/22/19 1955 11/23/19 0457  BP: 114/61 (!) 111/59  120/61  Pulse: 75 79  79  Resp:  20  (!) 22  Temp: 99 F (37.2 C) 98.1 F (36.7 C)  98.1 F (36.7 C)  TempSrc: Oral Oral  Oral  SpO2:  94% 91% 92% 92%  Weight:      Height:        Intake/Output Summary (Last 24 hours) at 11/23/2019 0720 Last data filed at 11/23/2019 0502 Gross per 24 hour  Intake 1023.9  ml  Output 1600 ml  Net -576.1 ml   Filed Weights   11/21/19 2000  Weight: 76.2 kg    Examination:  General exam: awake, alert, no acute distress HEENT: moist mucus membranes, hearing grossly normal, eyes watery Respiratory system: Bibasilar crackles and diffuse expiratory wheezing, no rales or rhonchi, normal respiratory effort.  On 2.5 L/min nasal cannula oxygen. Cardiovascular system: normal S1/S2, RRR, no JVD, murmurs, rubs, gallops, no pedal edema.   Central nervous system: alert and oriented x4. no gross focal neurologic deficits, normal speech Extremities: moves all, no cyanosis, normal tone Skin: dry, intact, normal temperature Psychiatry: normal mood, congruent affect, judgement and insight appear normal    Data Reviewed: I have personally reviewed following labs and imaging studies  CBC: Recent Labs  Lab 11/21/19 1252 11/22/19 0342  WBC 3.2* 3.6*  NEUTROABS  --  2.4  HGB 12.7 11.3*  HCT 39.2 34.6*  MCV 92.7 94.0  PLT 145* 0000000*   Basic Metabolic Panel: Recent Labs  Lab 11/21/19 1252 11/22/19 0342  NA 141 142  K 3.5 3.5  CL 101 105  CO2 29 26  GLUCOSE 120* 121*  BUN 12 14  CREATININE 0.63 0.55  CALCIUM 8.8* 8.3*   GFR: Estimated Creatinine Clearance: 82.6 mL/min (by C-G formula based on SCr of 0.55 mg/dL). Liver Function Tests: Recent Labs  Lab 11/22/19 0342  AST 52*  ALT 49*  ALKPHOS 63  BILITOT 0.6  PROT 6.4*  ALBUMIN 3.3*   No results for input(s): LIPASE, AMYLASE in the last 168 hours. No results for input(s): AMMONIA in the last 168 hours. Coagulation Profile: No results for input(s): INR, PROTIME in the last 168 hours. Cardiac Enzymes: No results for input(s): CKTOTAL, CKMB, CKMBINDEX, TROPONINI in the last 168 hours. BNP (last 3 results) No results for input(s): PROBNP in the last 8760 hours. HbA1C: No results for input(s): HGBA1C in the last 72 hours. CBG: No results for input(s): GLUCAP in the last 168 hours. Lipid  Profile: No results for input(s): CHOL, HDL, LDLCALC, TRIG, CHOLHDL, LDLDIRECT in the last 72 hours. Thyroid Function Tests: No results for input(s): TSH, T4TOTAL, FREET4, T3FREE, THYROIDAB in the last 72 hours. Anemia Panel: Recent Labs    11/21/19 2053 11/22/19 0342  FERRITIN 282 281   Sepsis Labs: Recent Labs  Lab 11/21/19 2053  PROCALCITON <0.10    Recent Results (from the past 240 hour(s))  Respiratory Panel by RT PCR (Flu A&B, Covid) - Nasopharyngeal Swab     Status: Abnormal   Collection Time: 11/21/19  8:53 PM   Specimen: Nasopharyngeal Swab  Result Value Ref Range Status   SARS Coronavirus 2 by RT PCR POSITIVE (A) NEGATIVE Final    Comment: RESULT CALLED TO, READ BACK BY AND VERIFIED WITH: KRUSTIN JOYNER ON 11/22/19 AT 0151 [place] (NOTE) SARS-CoV-2 target nucleic acids are DETECTED. SARS-CoV-2 RNA is generally detectable in upper respiratory specimens  during the acute phase of infection. Positive results are indicative of the presence of the identified virus, but do not rule out bacterial infection or co-infection with other pathogens not detected by the test. Clinical correlation with patient history and other diagnostic information is necessary to determine patient infection status. The expected result is Negative. Fact Sheet for Patients:  PinkCheek.be Fact Sheet for Healthcare Providers: GravelBags.it This test is not yet approved or cleared by the [place] FDA and  has been authorized for detection and/or diagnosis of SARS-CoV-2 by FDA under an Emergency Use Authorization (EUA).  This EUA will remain in effect (meaning this test can be used)  for the duration of  the COVID-19 declaration under Section 564(b)(1) of the Act, 21 U.S.C. section 360bbb-3(b)(1), unless the authorization is terminated or revoked sooner.    Influenza A by PCR NEGATIVE NEGATIVE Final   Influenza B by PCR NEGATIVE NEGATIVE  Final    Comment: (NOTE) The Xpert Xpress SARS-CoV-2/FLU/RSV assay is intended as an aid in  the diagnosis of influenza from Nasopharyngeal swab specimens and  should not be used as a sole basis for treatment. Nasal washings and  aspirates are unacceptable for Xpert Xpress SARS-CoV-2/FLU/RSV  testing. Fact Sheet for Patients: PinkCheek.be Fact Sheet for Healthcare Providers: GravelBags.it This test is not yet approved or cleared by the [place] FDA and  has been authorized for detection and/or diagnosis of SARS-CoV-2 by  FDA under an Emergency Use Authorization (EUA). This EUA will remain  in effect (meaning this test can be used) for the duration of the  Covid-19 declaration under Section 564(b)(1) of the Act, 21  U.S.C. section 360bbb-3(b)(1), unless the authorization is  terminated or revoked. Performed at [facility], [address] [place], [place] [address]          Radiology Studies: DG Chest 2 View  Result Date: 11/21/2019 CLINICAL DATA:  Cough, shortness of breath, COVID-19 positive EXAM: CHEST - 2 VIEW COMPARISON:  07/13/2007 FINDINGS: The heart size and mediastinal contours are within normal limits. Lung volumes are low. There are patchy bibasilar airspace opacities. No pleural effusion or pneumothorax. S-shaped scoliotic curvature. IMPRESSION: Patchy bibasilar airspace opacities suspicious for multifocal atypical/viral pneumonia. Electronically Signed   By: Davina Poke D.O.   On: 11/21/2019 13:27   CT ANGIO CHEST PE W OR WO CONTRAST  Result Date: 11/22/2019 CLINICAL DATA:  Shortness of breath Elevated D-dimer, COVID-19, rule out PE. History of RIGHT breast cancer. EXAM: CT ANGIOGRAPHY CHEST WITH CONTRAST TECHNIQUE: Multidetector CT imaging of the chest was performed using the standard protocol during bolus administration of intravenous contrast. Multiplanar CT image reconstructions and MIPs  were obtained to evaluate the vascular anatomy. CONTRAST:  127mL OMNIPAQUE IOHEXOL 350 MG/ML SOLN COMPARISON:  Chest x-ray 11/21/2019 FINDINGS: Cardiovascular: Pulmonary arteries are well opacified. No acute pulmonary embolus. Heart size is normal. No pericardial effusion. No significant coronary artery calcifications. No significant atherosclerosis of the thoracic aorta. Mediastinum/Nodes: Mildly thickened esophageal wall raising the question of esophagitis. No mass identified. Small hiatal hernia present. No significant mediastinal or axillary adenopathy. Is difficult to exclude hilar adenopathy given the presence of parenchymal opacities in the lungs. The visualized portion of the thyroid gland has a normal appearance. Lungs/Pleura: There are multifocal ground-glass opacities and dependent consolidation bilat. Air bronchograms are seen primarily at the bases. No pleural effusion. Airways are patent. Upper Abdomen: There is small hernia of the gastric fundus through the MEDIAL aspect of the LEFT hemidiaphragm, discrete from the diaphragmatic hiatus for the esophagus. Stomach is filled with debris. Intrarenal calculi identified in the RIGHT kidney. Musculoskeletal: Scoliosis and degenerative changes of the thoracolumbar spine. Review of the MIP images confirms the above findings. IMPRESSION: 1. Technically adequate exam showing no acute pulmonary embolus. 2. Small hiatal hernia. 3. Mildly thickened esophageal wall raising the question of esophagitis.  4. Small hernia of the gastric fundus through the MEDIAL aspect of the LEFT hemidiaphragm, discrete from the diaphragmatic hiatus for the esophagus. 5. Intrarenal calculi in the RIGHT kidney. 6. Scoliosis and degenerative changes. 7. Aortic Atherosclerosis (ICD10-I70.0). Electronically Signed   By: Nolon Nations M.D.   On: 11/22/2019 13:38        Scheduled Meds: . vitamin C  500 mg Oral Daily  . aspirin EC  81 mg Oral Daily  . cholecalciferol  1,000 Units  Oral Daily  . dexamethasone (DECADRON) injection  6 mg Intravenous Q24H  . enoxaparin (LOVENOX) injection  40 mg Subcutaneous Q24H  . famotidine  20 mg Oral BID  . guaiFENesin  600 mg Oral BID  . letrozole  2.5 mg Oral Daily  . loratadine  10 mg Oral Daily  . Melatonin  2 tablet Oral QHS  . multivitamin with minerals  1 tablet Oral Daily  . zinc sulfate  220 mg Oral Daily   Continuous Infusions: . remdesivir 100 mg in NS 100 mL Stopped (11/22/19 1003)     LOS: 2 days    Time spent: 23 minutes    Ezekiel Slocumb, DO [facility]   If 7PM-7AM, please contact night-coverage [email or web address] Password [facility] 11/23/2019, 7:20 AM

## 2019-11-24 LAB — COMPREHENSIVE METABOLIC PANEL
ALT: 56 U/L — ABNORMAL HIGH (ref 0–44)
AST: 48 U/L — ABNORMAL HIGH (ref 15–41)
Albumin: 3.2 g/dL — ABNORMAL LOW (ref 3.5–5.0)
Alkaline Phosphatase: 63 U/L (ref 38–126)
Anion gap: 11 (ref 5–15)
BUN: 22 mg/dL — ABNORMAL HIGH (ref 6–20)
CO2: 26 mmol/L (ref 22–32)
Calcium: 8.9 mg/dL (ref 8.9–10.3)
Chloride: 106 mmol/L (ref 98–111)
Creatinine, Ser: 0.46 mg/dL (ref 0.44–1.00)
GFR calc Af Amer: >60 mL/min (ref >60)
GFR calc non Af Amer: >60 mL/min (ref >60)
Glucose, Bld: 123 mg/dL — ABNORMAL HIGH (ref 70–99)
Potassium: 3.9 mmol/L (ref 3.5–5.1)
Sodium: 143 mmol/L (ref 135–145)
Total Bilirubin: 0.6 mg/dL (ref 0.3–1.2)
Total Protein: 6.5 g/dL (ref 6.5–8.1)

## 2019-11-24 LAB — CBC WITH DIFFERENTIAL/PLATELET
Abs Immature Granulocytes: 0.03 10*3/uL (ref 0.00–0.07)
Basophils Absolute: 0 10*3/uL (ref 0.0–0.1)
Basophils Relative: 0 %
Eosinophils Absolute: 0 10*3/uL (ref 0.0–0.5)
Eosinophils Relative: 0 %
HCT: 35.3 % — ABNORMAL LOW (ref 36.0–46.0)
Hemoglobin: 11.6 g/dL — ABNORMAL LOW (ref 12.0–15.0)
Immature Granulocytes: 1 %
Lymphocytes Relative: 17 %
Lymphs Abs: 0.8 10*3/uL (ref 0.7–4.0)
MCH: 30.6 pg (ref 26.0–34.0)
MCHC: 32.9 g/dL (ref 30.0–36.0)
MCV: 93.1 fL (ref 80.0–100.0)
Monocytes Absolute: 0.4 10*3/uL (ref 0.1–1.0)
Monocytes Relative: 9 %
Neutro Abs: 3.3 10*3/uL (ref 1.7–7.7)
Neutrophils Relative %: 73 %
Platelets: 245 10*3/uL (ref 150–400)
RBC: 3.79 MIL/uL — ABNORMAL LOW (ref 3.87–5.11)
RDW: 13.1 % (ref 11.5–15.5)
Smear Review: NORMAL
WBC: 4.5 10*3/uL (ref 4.0–10.5)
nRBC: 0 % (ref 0.0–0.2)

## 2019-11-24 LAB — C-REACTIVE PROTEIN: CRP: 1.3 mg/dL — ABNORMAL HIGH (ref <1.0)

## 2019-11-24 LAB — FERRITIN: Ferritin: 352 ng/mL — ABNORMAL HIGH (ref 11–307)

## 2019-11-24 LAB — FIBRIN DERIVATIVES D-DIMER (ARMC ONLY): Fibrin derivatives D-dimer (ARMC): 568.27 ng/mL (FEU) — ABNORMAL HIGH (ref 0.00–499.00)

## 2019-11-24 MED ORDER — SODIUM CHLORIDE 0.9 % IV SOLN
INTRAVENOUS | Status: DC | PRN
  Administered 2019-11-24: 250 mL via INTRAVENOUS

## 2019-11-24 NOTE — Progress Notes (Signed)
PROGRESS NOTE    Lacey Garcia  [id number] DOB: [birth year] DOA: 11/21/2019  PCP: Idelle Crouch, MD    LOS - 3   Brief Narrative:  59 y.o.femalewith a history of GERD, urolithiasis, right breast cancer status post right lumpectomy and radiotherapy, presented to theEDwith progressivedyspnea and dry cough for two days, having [facility]. Associated fevers up to 102 F, poor appetite, diarrhea. In the ED, febrile 102.2 F, tachypneic RR 32, mildly hypoxic 88-90% on room air improved to 98% on 2 L/min oxygen. Notable labs included K 3.5, Ca 11, normal troponin, leukopenia WBC 3.2k. Chest xray showed low lung volumes with patchy bibasilar airspace opacities consistent with multifocal pneumonia. Rapid COVID-19 antigen test negative, but PCR later returned positive. She is admitted for management of acute hypoxic respiratory failure secondary to COVID-10 pneumonia.  1/14 was on 2 L/min with spO2 96%, CTA negative for PE but showed b/l GG opacities. 1/15 was on 3 L/min with spO2 92% 1/16 was on 2.5 L/min this AM with sat 95+%, weaned to 1 L/min and off  Subjective 1/16: Patient reports that she is feeling better today.  Is using incentive spirometer and improving volumes.  States flutter valve helping her produce phlegm.  Was able to sleep well last night.  No fevers or chills, no chest pain, nausea vomiting or diarrhea.  No acute events reported.  She inquires about discharge home tomorrow, will do oxygen qualification today to see if it will be needed on discharge.  Assessment & Plan:   Principal Problem:   Acute respiratory failure with hypoxia (HCC) Active Problems:   COVID-19   Acute hypoxemic respiratory failure due to COVID-19secondary to Multifocal Pneumonia due to COVID-19 virus Presented with progressive shortness of breath, hypoxia, cough, with chest x-ray showingbibasilar airspace opacities consistent withmultifocal  pneumonia and Covid test positive. CTA chest negative for PE, showed bilateral ground glass opacities.  --[id number] --Oxygen requirements:2 L/min --Remdesivir: Started on1/13/21 PM --Steroids: Started on1/14/21 --Vitamin C and Zinc:Per protocol --Antibiotics:Procalcitoninnegative, antibioticsdiscontinued --Diuretics:No history of CHF, clinically euvolemic --maintain euvolemia to net-neg fluid balance --Actemra:not indicated at this time, patient agreeable if CRP rising or increasing O2 needs --maintain airborne and contact precautions --supplemental oxygen, maintain o2 sat > 90% --albuterolinhaler scheduled q6h --Mucinex tabs BID, Robitussin DM PRN cough --encourage patient to maintain awake prone positive for 16+hours a day as possible, minimum 2-3 hours at night --incentive spirometer, flutter valve  --monitor inflammatory markers daily to guide therapy  Chest Pain, pleuritic secondary to above, present on admission -resolved.  Troponin trended and negative. Continue aspirin.  History right breast cancer status post right lumpectomy and radiotherapy. --continueFemara   DVT prophylaxis: Lovenox   Code Status: Full Code  Family Communication: None at bedside Disposition Plan: Expect discharge home tomorrow.  RN did oxygen qualification today and patient did not qualify, will not need unless she worsens.   Will refer to [facility] infusion [facility] for final remdesivir dose on Monday.   Consultants:   None  Procedures:   None  Antimicrobials:   Rocephin, azithromycin 1/13-1/14   Objective: Vitals:   11/23/19 1235 11/23/19 2028 11/24/19 0434 11/24/19 1301  BP: (!) 118/58 126/63 132/67 118/66  Pulse: 83 80 71 76  Resp: 16 16 20 19   Temp: 98.7 F (37.1 C) 98 F (36.7 C) 97.9 F (36.6 C) 98.7 F (37.1 C)  TempSrc: Oral Oral Oral Oral  SpO2: 94% 93% 95% 95%  Weight:      Height:        Intake/Output Summary (Last 24  hours) at 11/24/2019 1304 Last data filed at 11/24/2019 0538 Gross per 24 hour  Intake 100 ml  Output 500 ml  Net -400 ml   Filed Weights   11/21/19 2000  Weight: 76.2 kg    Examination:  General exam: awake, alert, no acute distress HEENT: moist mucus membranes, hearing grossly normal  Respiratory system: bibasilar fine crackles improved, no wheezing today, no rhonchi, normal respiratory effort.  On 1 L/min oxygen. Cardiovascular system: normal S1/S2, RRR, no JVD, murmurs, rubs, gallops, no pedal edema.   Central nervous system: alert and oriented x4. no gross focal neurologic deficits, normal speech Extremities: moves all, no edema, normal tone Skin: dry, intact, normal temperature, face flushed otherwise normal color Psychiatry: normal mood, congruent affect, judgement and insight appear normal    Data Reviewed: I have personally reviewed following labs and imaging studies  CBC: Recent Labs  Lab 11/21/19 1252 11/22/19 0342 11/23/19 0658 11/24/19 0514  WBC 3.2* 3.6* 2.7* 4.5  NEUTROABS  --  2.4 1.7 3.3  HGB 12.7 11.3* 11.5* 11.6*  HCT 39.2 34.6* 35.6* 35.3*  MCV 92.7 94.0 93.0 93.1  PLT 145* 137* 182 [id number]   Basic Metabolic Panel: Recent Labs  Lab 11/21/19 1252 11/22/19 0342 11/23/19 0658 11/24/19 0514  NA 141 142 144 143  K 3.5 3.5 3.7 3.9  CL 101 105 108 106  CO2 29 26 27 26   GLUCOSE 120* 121* 128* 123*  BUN 12 14 17  22*  CREATININE 0.63 0.55 0.57 0.46  CALCIUM 8.8* 8.3* 8.9 8.9   GFR: Estimated Creatinine Clearance: 82.6 mL/min (by C-G formula based on SCr of 0.46 mg/dL). Liver Function Tests: Recent Labs  Lab 11/22/19 0342 11/23/19 0658 11/24/19 0514  AST 52* 27 48*  ALT 49* 42 56*  ALKPHOS 63 64 63  BILITOT 0.6 0.5 0.6  PROT 6.4* 6.5 6.5  ALBUMIN 3.3* 3.2* 3.2*   No results for input(s): LIPASE, AMYLASE in the last 168 hours. No results for input(s): AMMONIA in the last 168 hours. Coagulation Profile: No results for input(s): INR, PROTIME  in the last 168 hours. Cardiac Enzymes: No results for input(s): CKTOTAL, CKMB, CKMBINDEX, TROPONINI in the last 168 hours. BNP (last 3 results) No results for input(s): PROBNP in the last 8760 hours. HbA1C: No results for input(s): HGBA1C in the last 72 hours. CBG: No results for input(s): GLUCAP in the last 168 hours. Lipid Profile: No results for input(s): CHOL, HDL, LDLCALC, TRIG, CHOLHDL, LDLDIRECT in the last 72 hours. Thyroid Function Tests: No results for input(s): TSH, T4TOTAL, FREET4, T3FREE, THYROIDAB in the last 72 hours. Anemia Panel: Recent Labs    11/23/19 0658 11/24/19 0514  FERRITIN 318* 352*   Sepsis Labs: Recent Labs  Lab 11/21/19 2053  PROCALCITON <0.10    Recent Results (from the past 240 hour(s))  Respiratory Panel by RT PCR (Flu A&B, Covid) - Nasopharyngeal Swab     Status: Abnormal   Collection Time: 11/21/19  8:53 PM   Specimen: Nasopharyngeal Swab  Result Value Ref Range Status   SARS Coronavirus 2 by RT PCR POSITIVE (A) NEGATIVE Final    Comment: RESULT CALLED TO, READ BACK BY AND VERIFIED WITH: KRUSTIN JOYNER ON 11/22/19 AT 0151 [place] (NOTE) SARS-CoV-2 target nucleic acids are DETECTED. SARS-CoV-2 RNA is generally detectable in upper respiratory specimens  during the acute phase of infection. Positive results are indicative of  the presence of the identified virus, but do not rule out bacterial infection or co-infection with other pathogens not detected by the test. Clinical correlation with patient history and other diagnostic information is necessary to determine patient infection status. The expected result is Negative. Fact Sheet for Patients:  PinkCheek.be Fact Sheet for Healthcare Providers: GravelBags.it This test is not yet approved or cleared by the [place] FDA and  has been authorized for detection and/or diagnosis of SARS-CoV-2 by FDA under an Emergency Use Authorization  (EUA).  This EUA will remain in effect (meaning this test can be used)  for the duration of  the COVID-19 declaration under Section 564(b)(1) of the Act, 21 U.S.C. section 360bbb-3(b)(1), unless the authorization is terminated or revoked sooner.    Influenza A by PCR NEGATIVE NEGATIVE Final   Influenza B by PCR NEGATIVE NEGATIVE Final    Comment: (NOTE) The Xpert Xpress SARS-CoV-2/FLU/RSV assay is intended as an aid in  the diagnosis of influenza from Nasopharyngeal swab specimens and  should not be used as a sole basis for treatment. Nasal washings and  aspirates are unacceptable for Xpert Xpress SARS-CoV-2/FLU/RSV  testing. Fact Sheet for Patients: PinkCheek.be Fact Sheet for Healthcare Providers: GravelBags.it This test is not yet approved or cleared by the [place] FDA and  has been authorized for detection and/or diagnosis of SARS-CoV-2 by  FDA under an Emergency Use Authorization (EUA). This EUA will remain  in effect (meaning this test can be used) for the duration of the  Covid-19 declaration under Section 564(b)(1) of the Act, 21  U.S.C. section 360bbb-3(b)(1), unless the authorization is  terminated or revoked. Performed at [facility], [address] [place], [place] [address]          Radiology Studies: CT ANGIO CHEST PE W OR WO CONTRAST  Result Date: 11/22/2019 CLINICAL DATA:  Shortness of breath Elevated D-dimer, COVID-19, rule out PE. History of RIGHT breast cancer. EXAM: CT ANGIOGRAPHY CHEST WITH CONTRAST TECHNIQUE: Multidetector CT imaging of the chest was performed using the standard protocol during bolus administration of intravenous contrast. Multiplanar CT image reconstructions and MIPs were obtained to evaluate the vascular anatomy. CONTRAST:  155mL OMNIPAQUE IOHEXOL 350 MG/ML SOLN COMPARISON:  Chest x-ray 11/21/2019 FINDINGS: Cardiovascular: Pulmonary arteries are well opacified. No  acute pulmonary embolus. Heart size is normal. No pericardial effusion. No significant coronary artery calcifications. No significant atherosclerosis of the thoracic aorta. Mediastinum/Nodes: Mildly thickened esophageal wall raising the question of esophagitis. No mass identified. Small hiatal hernia present. No significant mediastinal or axillary adenopathy. Is difficult to exclude hilar adenopathy given the presence of parenchymal opacities in the lungs. The visualized portion of the thyroid gland has a normal appearance. Lungs/Pleura: There are multifocal ground-glass opacities and dependent consolidation bilat. Air bronchograms are seen primarily at the bases. No pleural effusion. Airways are patent. Upper Abdomen: There is small hernia of the gastric fundus through the MEDIAL aspect of the LEFT hemidiaphragm, discrete from the diaphragmatic hiatus for the esophagus. Stomach is filled with debris. Intrarenal calculi identified in the RIGHT kidney. Musculoskeletal: Scoliosis and degenerative changes of the thoracolumbar spine. Review of the MIP images confirms the above findings. IMPRESSION: 1. Technically adequate exam showing no acute pulmonary embolus. 2. Small hiatal hernia. 3. Mildly thickened esophageal wall raising the question of esophagitis. 4. Small hernia of the gastric fundus through the MEDIAL aspect of the LEFT hemidiaphragm, discrete from the diaphragmatic hiatus for the esophagus. 5. Intrarenal calculi in the RIGHT kidney. 6.  Scoliosis and degenerative changes. 7. Aortic Atherosclerosis (ICD10-I70.0). Electronically Signed   By: Nolon Nations M.D.   On: 11/22/2019 13:38        Scheduled Meds: . albuterol  2 puff Inhalation Q6H  . vitamin C  500 mg Oral Daily  . aspirin EC  81 mg Oral Daily  . cholecalciferol  1,000 Units Oral Daily  . dexamethasone (DECADRON) injection  6 mg Intravenous Q24H  . enoxaparin (LOVENOX) injection  40 mg Subcutaneous Q24H  . famotidine  20 mg Oral BID   . guaiFENesin  600 mg Oral BID  . letrozole  2.5 mg Oral Daily  . loratadine  10 mg Oral Daily  . Melatonin  2 tablet Oral QHS  . multivitamin with minerals  1 tablet Oral Daily  . zinc sulfate  220 mg Oral Daily   Continuous Infusions: . sodium chloride 250 mL (11/24/19 1040)  . remdesivir 100 mg in NS 100 mL 100 mg (11/24/19 1041)     LOS: 3 days    Time spent: 35 minutes    Ezekiel Slocumb, DO [facility]   If 7PM-7AM, please contact night-coverage [email or web address] Password [facility] 11/24/2019, 1:04 PM

## 2019-11-24 NOTE — Progress Notes (Signed)
SATURATION QUALIFICATIONS: (This note is used to comply with regulatory documentation for home oxygen)  Patient Saturations on Room Air at Rest = 96%  Patient Saturations on Room Air while Ambulating = 93%  

## 2019-11-25 LAB — COMPREHENSIVE METABOLIC PANEL
ALT: 74 U/L — ABNORMAL HIGH (ref 0–44)
AST: 37 U/L (ref 15–41)
Albumin: 3.3 g/dL — ABNORMAL LOW (ref 3.5–5.0)
Alkaline Phosphatase: 64 U/L (ref 38–126)
Anion gap: 10 (ref 5–15)
BUN: 20 mg/dL (ref 6–20)
CO2: 27 mmol/L (ref 22–32)
Calcium: 8.9 mg/dL (ref 8.9–10.3)
Chloride: 105 mmol/L (ref 98–111)
Creatinine, Ser: 0.58 mg/dL (ref 0.44–1.00)
GFR calc Af Amer: >60 mL/min (ref >60)
GFR calc non Af Amer: >60 mL/min (ref >60)
Glucose, Bld: 102 mg/dL — ABNORMAL HIGH (ref 70–99)
Potassium: 3.9 mmol/L (ref 3.5–5.1)
Sodium: 142 mmol/L (ref 135–145)
Total Bilirubin: 0.7 mg/dL (ref 0.3–1.2)
Total Protein: 6.6 g/dL (ref 6.5–8.1)

## 2019-11-25 LAB — CBC WITH DIFFERENTIAL/PLATELET
Abs Immature Granulocytes: 0.05 10*3/uL (ref 0.00–0.07)
Basophils Absolute: 0 10*3/uL (ref 0.0–0.1)
Basophils Relative: 0 %
Eosinophils Absolute: 0 10*3/uL (ref 0.0–0.5)
Eosinophils Relative: 0 %
HCT: 38.3 % (ref 36.0–46.0)
Hemoglobin: 12.6 g/dL (ref 12.0–15.0)
Immature Granulocytes: 1 %
Lymphocytes Relative: 20 %
Lymphs Abs: 0.9 10*3/uL (ref 0.7–4.0)
MCH: 30.1 pg (ref 26.0–34.0)
MCHC: 32.9 g/dL (ref 30.0–36.0)
MCV: 91.6 fL (ref 80.0–100.0)
Monocytes Absolute: 0.5 10*3/uL (ref 0.1–1.0)
Monocytes Relative: 10 %
Neutro Abs: 3.3 10*3/uL (ref 1.7–7.7)
Neutrophils Relative %: 69 %
Platelets: 291 10*3/uL (ref 150–400)
RBC: 4.18 MIL/uL (ref 3.87–5.11)
RDW: 13.1 % (ref 11.5–15.5)
Smear Review: NORMAL
WBC: 4.8 10*3/uL (ref 4.0–10.5)
nRBC: 0 % (ref 0.0–0.2)

## 2019-11-25 LAB — FIBRIN DERIVATIVES D-DIMER (ARMC ONLY): Fibrin derivatives D-dimer (ARMC): 427.62 ng/mL (FEU) (ref 0.00–499.00)

## 2019-11-25 LAB — C-REACTIVE PROTEIN: CRP: 0.8 mg/dL (ref <1.0)

## 2019-11-25 LAB — FERRITIN: Ferritin: 260 ng/mL (ref 11–307)

## 2019-11-25 MED ORDER — ASCORBIC ACID 500 MG PO TABS: 500.0000 mg | ORAL_TABLET | Freq: Every day | ORAL | Status: DC

## 2019-11-25 MED ORDER — DEXAMETHASONE 6 MG PO TABS: 6.0000 mg | ORAL_TABLET | Freq: Every day | ORAL | 0 refills | Status: AC

## 2019-11-25 MED ORDER — MELATONIN 10-10 MG PO TBCR: 1.0000 | EXTENDED_RELEASE_TABLET | Freq: Every evening | ORAL | Status: DC

## 2019-11-25 MED ORDER — ALBUTEROL SULFATE HFA 108 (90 BASE) MCG/ACT IN AERS: 2.0000 | INHALATION_SPRAY | Freq: Four times a day (QID) | RESPIRATORY_TRACT | 0 refills | Status: DC

## 2019-11-25 MED ORDER — GUAIFENESIN-DM 100-10 MG/5ML PO SYRP: 10.0000 mL | ORAL_SOLUTION | ORAL | 0 refills | Status: DC | PRN

## 2019-11-25 MED ORDER — CETIRIZINE HCL 10 MG PO TABS: 10.0000 mg | ORAL_TABLET | Freq: Every day | ORAL | Status: DC

## 2019-11-25 MED ORDER — HYDROCOD POLST-CPM POLST ER 10-8 MG/5ML PO SUER: 5.0000 mL | Freq: Two times a day (BID) | ORAL | 0 refills | Status: AC | PRN

## 2019-11-25 MED ORDER — VITAMIN D3 25 MCG PO TABS: 1000.0000 [IU] | ORAL_TABLET | Freq: Every day | ORAL | Status: AC

## 2019-11-25 NOTE — Plan of Care (Signed)
  Problem: Clinical Measurements: Goal: Respiratory complications will improve Outcome: Progressing   Problem: Clinical Measurements: Goal: Cardiovascular complication will be avoided Outcome: Progressing   Problem: Clinical Measurements: Goal: Ability to maintain clinical measurements within normal limits will improve Outcome: Progressing  Patient has remained on room air during shift.

## 2019-11-25 NOTE — Progress Notes (Signed)
Pt discharged per MD order. IV removed. Discharge instructions reviewed with pt. Pt verbalized understanding. Covid discharge instructions reviewed with pt. Pt taken downstairs in wheelchair by RN.

## 2019-11-25 NOTE — Discharge Summary (Signed)
Physician Discharge Summary  Adarah Luevano [id number] DOB: 12/26/1960 DOA: 11/21/2019  PCP: Idelle Crouch, MD  Admit date: 11/21/2019 Discharge date: 11/25/2019  Admitted From: Home Disposition:  Home  Recommendations for Outpatient Follow-up:  1. Follow up with PCP in 1-2 weeks 2. Please obtain BMP/CBC in one week   Home Health: No Equipment/Devices: None   Discharge Condition: Stable  CODE STATUS: Full  Diet recommendation: Regular   Brief/Interim Summary:  59 y.o.femalewith a history of GERD, urolithiasis, right breast cancer status post right lumpectomy and radiotherapy, presented to theED on 1/13/21with progressivedyspnea and dry cough for two days, having had a positive COVID-19 test at [facility] two days prior. Associated fevers up to 102 F, poor appetite, diarrhea. In the ED, febrile 102.2 F, tachypneic RR 32, mildly hypoxic 88-90% on room air improved to 98% on 2 L/min oxygen. Notable labs included K 3.5, Ca 11, normal troponin, leukopenia WBC 3.2k. Chest xray showed low lung volumes with patchy bibasilar airspace opacities consistent with multifocal pneumonia. Rapid COVID-19 antigen test negative, but PCR later returned positive. She was admitted for management of acute hypoxic respiratory failure secondary to COVID-10 pneumonia.  Treated with remdesivir and steroids, vitamin C, zinc, bronchodilators and antitussives.   1/14 was on 2 L/min with spO2 96%, CTA negative for PE but showed b/l GG opacities. 1/15 was on 3 L/min with spO2 92% 1/16 was on 2.5 L/min this AM with sat 95+%, weaned to 1 L/min and off 1/17 ambulating on room air with sat 93%. Clinically improved and stable for discharge home   Discharge Diagnoses: Principal Problem:   Acute respiratory failure with hypoxia ([place]) Active Problems:   COVID-19   Acute hypoxemic respiratory failure due to COVID-19secondary to Multifocal Pneumonia due to COVID-19 virus Presented with progressive shortness of  breath, hypoxia, cough, with chest x-ray showingbibasilar airspace opacities consistent withmultifocal pneumonia and Covid test positive. CTA chest negative for PE, showed bilateral ground glass opacities.  --[id number] [facility] --Oxygen requirements:2 L/min --Remdesivir: Started on1/13/21 PM --Steroids: Started on1/14/21 --Vitamin C and Zinc:Per protocol --Antibiotics:Procalcitoninnegative, antibioticsdiscontinued --Diuretics:No history of CHF, clinically euvolemic --maintain euvolemia to net-neg fluid balance --Actemra:not indicated at this time, patient agreeable if CRP rising or increasing O2 needs --maintain airborne and contact precautions --supplemental oxygen, maintain o2 sat > 90% --albuterolinhalerscheduledq6h --Mucinex tabs BID, Robitussin DM PRN cough --encourage patient to maintain awake prone positive for 16+hours a day as possible, minimum 2-3 hours at night --incentive spirometer,flutter valve --monitor inflammatory markers daily to guide therapy   Discharge Instructions   Covid-19 isolation precautions per CDC and Health Department guidelines  Discharge Instructions    Call MD for:   Complete by: As directed    Worsening shortness of breath   Call MD for:  extreme fatigue   Complete by: As directed    Call MD for:  temperature >100.4   Complete by: As directed    Diet - low sodium heart healthy   Complete by: As directed    Increase activity slowly   Complete by: As directed      Allergies as of 11/25/2019      Reactions   Sulfa Antibiotics Hives, Rash      Medication List    TAKE these medications   albuterol 108 (90 Base) MCG/ACT inhaler Commonly known as: VENTOLIN HFA Inhale 2 puffs into the lungs every 6 (six) hours.   ascorbic acid 500 MG tablet Commonly known as: VITAMIN C Take 1 tablet (500 mg  total) by mouth daily. What changed: how much to take   cetirizine 10 MG tablet Commonly known as: ZYRTEC Take  1 tablet (10 mg total) by mouth daily.   chlorpheniramine-HYDROcodone 10-8 MG/5ML Suer Commonly known as: TUSSIONEX Take 5 mLs by mouth every 12 (twelve) hours as needed for up to 7 days for cough.   Coenzyme Q10 100 MG capsule Take 100 mg by mouth daily.   dexamethasone 6 MG tablet Commonly known as: Decadron Take 1 tablet (6 mg total) by mouth daily for 6 days.   famotidine 20 MG tablet Commonly known as: PEPCID Take 20 mg by mouth daily as needed for heartburn or indigestion.   guaiFENesin-dextromethorphan 100-10 MG/5ML syrup Commonly known as: ROBITUSSIN DM Take 10 mLs by mouth every 4 (four) hours as needed for cough.   letrozole 2.5 MG tablet Commonly known as: FEMARA Take 1 tablet (2.5 mg total) by mouth daily.   Melatonin 10-10 MG Tbcr Take 1 capsule by mouth every evening.   MULTI-VITAMIN DAILY PO Take 1 tablet by mouth daily.   Vitamin D3 25 MCG tablet Commonly known as: Vitamin D Take 1 tablet (1,000 Units total) by mouth daily. What changed:   medication strength  how much to take   zinc sulfate 220 (50 Zn) MG capsule Take 220 mg by mouth daily.       Allergies  Allergen Reactions  . Sulfa Antibiotics Hives and Rash    Consultations:  None    Procedures/Studies: DG Chest 2 View  Result Date: 11/21/2019 CLINICAL DATA:  Cough, shortness of breath, COVID-19 positive EXAM: CHEST - 2 VIEW COMPARISON:  07/13/2007 FINDINGS: The heart size and mediastinal contours are within normal limits. Lung volumes are low. There are patchy bibasilar airspace opacities. No pleural effusion or pneumothorax. S-shaped scoliotic curvature. IMPRESSION: Patchy bibasilar airspace opacities suspicious for multifocal atypical/viral pneumonia. Electronically Signed   By: Davina Poke D.O.   On: 11/21/2019 13:27   CT ANGIO CHEST PE W OR WO CONTRAST  Result Date: 11/22/2019 CLINICAL DATA:  Shortness of breath Elevated D-dimer, COVID-19, rule out PE. History of RIGHT  breast cancer. EXAM: CT ANGIOGRAPHY CHEST WITH CONTRAST TECHNIQUE: Multidetector CT imaging of the chest was performed using the standard protocol during bolus administration of intravenous contrast. Multiplanar CT image reconstructions and MIPs were obtained to evaluate the vascular anatomy. CONTRAST:  116mL OMNIPAQUE IOHEXOL 350 MG/ML SOLN COMPARISON:  Chest x-ray 11/21/2019 FINDINGS: Cardiovascular: Pulmonary arteries are well opacified. No acute pulmonary embolus. Heart size is normal. No pericardial effusion. No significant coronary artery calcifications. No significant atherosclerosis of the thoracic aorta. Mediastinum/Nodes: Mildly thickened esophageal wall raising the question of esophagitis. No mass identified. Small hiatal hernia present. No significant mediastinal or axillary adenopathy. Is difficult to exclude hilar adenopathy given the presence of parenchymal opacities in the lungs. The visualized portion of the thyroid gland has a normal appearance. Lungs/Pleura: There are multifocal ground-glass opacities and dependent consolidation bilat. Air bronchograms are seen primarily at the bases. No pleural effusion. Airways are patent. Upper Abdomen: There is small hernia of the gastric fundus through the MEDIAL aspect of the LEFT hemidiaphragm, discrete from the diaphragmatic hiatus for the esophagus. Stomach is filled with debris. Intrarenal calculi identified in the RIGHT kidney. Musculoskeletal: Scoliosis and degenerative changes of the thoracolumbar spine. Review of the MIP images confirms the above findings. IMPRESSION: 1. Technically adequate exam showing no acute pulmonary embolus. 2. Small hiatal hernia. 3. Mildly thickened esophageal wall raising the question of esophagitis.  4. Small hernia of the gastric fundus through the MEDIAL aspect of the LEFT hemidiaphragm, discrete from the diaphragmatic hiatus for the esophagus. 5. Intrarenal calculi in the RIGHT kidney. 6. Scoliosis and degenerative  changes. 7. Aortic Atherosclerosis (ICD10-I70.0). Electronically Signed   By: Nolon Nations M.D.   On: 11/22/2019 13:38       Subjective: Patient feeling well when seen this AM.  No fever/chills.  Breathing stable on room air without worsening SOB.  Cough better.  Using incentive spirometer.  No acute events reported.  Patient agreeable with plan for discharge home.   Discharge Exam: Vitals:   11/24/19 1909 11/24/19 2202  BP:  133/74  Pulse:  74  Resp:  20  Temp:  97.8 F (36.6 C)  SpO2: 92% 92%   Vitals:   11/24/19 1301 11/24/19 1500 11/24/19 1909 11/24/19 2202  BP: 118/66   133/74  Pulse: 76   74  Resp: 19   20  Temp: 98.7 F (37.1 C)   97.8 F (36.6 C)  TempSrc: Oral   Oral  SpO2: 95% 94% 92% 92%  Weight:      Height:        General: Pt is alert, awake, not in acute distress Cardiovascular: RRR, S1/S2 +, no rubs, no gallops Respiratory: decreased at bases but overall improved aeration, no wheezing, no rhonchi Abdominal: Soft, NT, ND, bowel sounds + Extremities: no edema, no cyanosis    The results of significant diagnostics from this hospitalization (including imaging, microbiology, ancillary and laboratory) are listed below for reference.     Microbiology: Recent Results (from the past 240 hour(s))  Respiratory Panel by RT PCR (Flu A&B, Covid) - Nasopharyngeal Swab     Status: Abnormal   Collection Time: 11/21/19  8:53 PM   Specimen: Nasopharyngeal Swab  Result Value Ref Range Status   SARS Coronavirus 2 by RT PCR POSITIVE (A) NEGATIVE Final    Comment: RESULT CALLED TO, READ BACK BY AND VERIFIED WITH: KRUSTIN JOYNER ON 11/22/19 AT 0151 [place] (NOTE) SARS-CoV-2 target nucleic acids are DETECTED. SARS-CoV-2 RNA is generally detectable in upper respiratory specimens  during the acute phase of infection. Positive results are indicative of the presence of the identified virus, but do not rule out bacterial infection or co-infection with other pathogens  not detected by the test. Clinical correlation with patient history and other diagnostic information is necessary to determine patient infection status. The expected result is Negative. Fact Sheet for Patients:  PinkCheek.be Fact Sheet for Healthcare Providers: GravelBags.it This test is not yet approved or cleared by the [place] FDA and  has been authorized for detection and/or diagnosis of SARS-CoV-2 by FDA under an Emergency Use Authorization (EUA).  This EUA will remain in effect (meaning this test can be used)  for the duration of  the COVID-19 declaration under Section 564(b)(1) of the Act, 21 U.S.C. section 360bbb-3(b)(1), unless the authorization is terminated or revoked sooner.    Influenza A by PCR NEGATIVE NEGATIVE Final   Influenza B by PCR NEGATIVE NEGATIVE Final    Comment: (NOTE) The Xpert Xpress SARS-CoV-2/FLU/RSV assay is intended as an aid in  the diagnosis of influenza from Nasopharyngeal swab specimens and  should not be used as a sole basis for treatment. Nasal washings and  aspirates are unacceptable for Xpert Xpress SARS-CoV-2/FLU/RSV  testing. Fact Sheet for Patients: PinkCheek.be Fact Sheet for Healthcare Providers: GravelBags.it This test is not yet approved or cleared by the [place] and  has been authorized for detection and/or diagnosis of SARS-CoV-2 by  FDA under an Emergency Use Authorization (EUA). This EUA will remain  in effect (meaning this test can be used) for the duration of the  Covid-19 declaration under Section 564(b)(1) of the Act, 21  U.S.C. section 360bbb-3(b)(1), unless the authorization is  terminated or revoked. Performed at [facility], [address] [place], [place] [address]      Labs: BNP (last 3 results) Recent Labs    11/21/19 2053  BNP 123456   Basic Metabolic Panel: Recent Labs   Lab 11/21/19 1252 11/22/19 0342 11/23/19 0658 11/24/19 0514  NA 141 142 144 143  K 3.5 3.5 3.7 3.9  CL 101 105 108 106  CO2 29 26 27 26   GLUCOSE 120* 121* 128* 123*  BUN 12 14 17  22*  CREATININE 0.63 0.55 0.57 0.46  CALCIUM 8.8* 8.3* 8.9 8.9   Liver Function Tests: Recent Labs  Lab 11/22/19 0342 11/23/19 0658 11/24/19 0514  AST 52* 27 48*  ALT 49* 42 56*  ALKPHOS 63 64 63  BILITOT 0.6 0.5 0.6  PROT 6.4* 6.5 6.5  ALBUMIN 3.3* 3.2* 3.2*   No results for input(s): LIPASE, AMYLASE in the last 168 hours. No results for input(s): AMMONIA in the last 168 hours. CBC: Recent Labs  Lab 11/21/19 1252 11/22/19 0342 11/23/19 0658 11/24/19 0514  WBC 3.2* 3.6* 2.7* 4.5  NEUTROABS  --  2.4 1.7 3.3  HGB 12.7 11.3* 11.5* 11.6*  HCT 39.2 34.6* 35.6* 35.3*  MCV 92.7 94.0 93.0 93.1  PLT 145* 137* 182 245   Cardiac Enzymes: No results for input(s): CKTOTAL, CKMB, CKMBINDEX, TROPONINI in the last 168 hours. BNP: Invalid input(s): POCBNP CBG: No results for input(s): GLUCAP in the last 168 hours. D-Dimer No results for input(s): DDIMER in the last 72 hours. Hgb A1c No results for input(s): HGBA1C in the last 72 hours. Lipid Profile No results for input(s): CHOL, HDL, LDLCALC, TRIG, CHOLHDL, LDLDIRECT in the last 72 hours. Thyroid function studies No results for input(s): TSH, T4TOTAL, T3FREE, THYROIDAB in the last 72 hours.  Invalid input(s): FREET3 Anemia work up [employer]    11/23/19 0658 11/24/19 0514  FERRITIN 318* 352*   Urinalysis No results found for: COLORURINE, APPEARANCEUR, LABSPEC, [place], GLUCOSEU, [place], [place], [place], [place], UROBILINOGEN, NITRITE, LEUKOCYTESUR Sepsis Labs Invalid input(s): PROCALCITONIN,  WBC,  LACTICIDVEN Microbiology Recent Results (from the past 240 hour(s))  Respiratory Panel by RT PCR (Flu A&B, Covid) - Nasopharyngeal Swab     Status: Abnormal   Collection Time: 11/21/19  8:53 PM   Specimen: Nasopharyngeal Swab   Result Value Ref Range Status   SARS Coronavirus 2 by RT PCR POSITIVE (A) NEGATIVE Final    Comment: RESULT CALLED TO, READ BACK BY AND VERIFIED WITH: KRUSTIN JOYNER ON 11/22/19 AT 0151 [place] (NOTE) SARS-CoV-2 target nucleic acids are DETECTED. SARS-CoV-2 RNA is generally detectable in upper respiratory specimens  during the acute phase of infection. Positive results are indicative of the presence of the identified virus, but do not rule out bacterial infection or co-infection with other pathogens not detected by the test. Clinical correlation with patient history and other diagnostic information is necessary to determine patient infection status. The expected result is Negative. Fact Sheet for Patients:  PinkCheek.be Fact Sheet for Healthcare Providers: GravelBags.it This test is not yet approved or cleared by the [place] FDA and  has been authorized for detection and/or diagnosis of SARS-CoV-2 by FDA under  an Emergency Use Authorization (EUA).  This EUA will remain in effect (meaning this test can be used)  for the duration of  the COVID-19 declaration under Section 564(b)(1) of the Act, 21 U.S.C. section 360bbb-3(b)(1), unless the authorization is terminated or revoked sooner.    Influenza A by PCR NEGATIVE NEGATIVE Final   Influenza B by PCR NEGATIVE NEGATIVE Final    Comment: (NOTE) The Xpert Xpress SARS-CoV-2/FLU/RSV assay is intended as an aid in  the diagnosis of influenza from Nasopharyngeal swab specimens and  should not be used as a sole basis for treatment. Nasal washings and  aspirates are unacceptable for Xpert Xpress SARS-CoV-2/FLU/RSV  testing. Fact Sheet for Patients: PinkCheek.be Fact Sheet for Healthcare Providers: GravelBags.it This test is not yet approved or cleared by the [place] FDA and  has been authorized for detection and/or  diagnosis of SARS-CoV-2 by  FDA under an Emergency Use Authorization (EUA). This EUA will remain  in effect (meaning this test can be used) for the duration of the  Covid-19 declaration under Section 564(b)(1) of the Act, 21  U.S.C. section 360bbb-3(b)(1), unless the authorization is  terminated or revoked. Performed at [facility], [address] [place], [place] [address]      Time coordinating discharge: Over 30 minutes  SIGNED:   Ezekiel Slocumb, DO [facility] 11/25/2019, 7:44 AM   If 7PM-7AM, please contact night-coverage [email or web address] Password [email or web address]

## 2019-12-17 ENCOUNTER — Encounter: Payer: Self-pay | Admitting: Oncology

## 2020-01-17 ENCOUNTER — Telehealth: Payer: Self-pay | Admitting: Oncology

## 2020-01-17 NOTE — Telephone Encounter (Signed)
Writer received voice mail from patient requesting to reschedule patient's appt to 04-11-20. Appts rescheduled per patient's request.

## 2020-03-10 ENCOUNTER — Other Ambulatory Visit: Payer: PRIVATE HEALTH INSURANCE

## 2020-03-10 ENCOUNTER — Ambulatory Visit: Payer: PRIVATE HEALTH INSURANCE | Admitting: Oncology

## 2020-03-24 ENCOUNTER — Encounter: Payer: Self-pay | Admitting: Oncology

## 2020-03-30 ENCOUNTER — Other Ambulatory Visit: Payer: Self-pay | Admitting: Oncology

## 2020-04-10 ENCOUNTER — Other Ambulatory Visit: Payer: Self-pay

## 2020-04-10 DIAGNOSIS — D0511 Intraductal carcinoma in situ of right breast: Secondary | ICD-10-CM

## 2020-04-11 ENCOUNTER — Telehealth: Payer: Self-pay | Admitting: *Deleted

## 2020-04-11 ENCOUNTER — Other Ambulatory Visit: Payer: Self-pay

## 2020-04-11 ENCOUNTER — Encounter: Payer: Self-pay | Admitting: Oncology

## 2020-04-11 ENCOUNTER — Inpatient Hospital Stay: Payer: PRIVATE HEALTH INSURANCE | Attending: Oncology

## 2020-04-11 ENCOUNTER — Inpatient Hospital Stay (HOSPITAL_BASED_OUTPATIENT_CLINIC_OR_DEPARTMENT_OTHER): Payer: PRIVATE HEALTH INSURANCE | Admitting: Oncology

## 2020-04-11 VITALS — BP 131/69 | HR 72 | Temp 97.5°F | Resp 20 | Wt 171.3 lb

## 2020-04-11 DIAGNOSIS — Z79811 Long term (current) use of aromatase inhibitors: Secondary | ICD-10-CM | POA: Insufficient documentation

## 2020-04-11 DIAGNOSIS — M8580 Other specified disorders of bone density and structure, unspecified site: Secondary | ICD-10-CM

## 2020-04-11 DIAGNOSIS — D0511 Intraductal carcinoma in situ of right breast: Secondary | ICD-10-CM | POA: Insufficient documentation

## 2020-04-11 DIAGNOSIS — Z85820 Personal history of malignant melanoma of skin: Secondary | ICD-10-CM | POA: Diagnosis not present

## 2020-04-11 DIAGNOSIS — Z79899 Other long term (current) drug therapy: Secondary | ICD-10-CM | POA: Insufficient documentation

## 2020-04-11 DIAGNOSIS — Z87442 Personal history of urinary calculi: Secondary | ICD-10-CM | POA: Insufficient documentation

## 2020-04-11 DIAGNOSIS — M810 Age-related osteoporosis without current pathological fracture: Secondary | ICD-10-CM | POA: Insufficient documentation

## 2020-04-11 DIAGNOSIS — K219 Gastro-esophageal reflux disease without esophagitis: Secondary | ICD-10-CM | POA: Diagnosis not present

## 2020-04-11 DIAGNOSIS — Z170 Estrogen receptor positive status [ER+]: Secondary | ICD-10-CM | POA: Diagnosis not present

## 2020-04-11 LAB — CBC WITH DIFFERENTIAL/PLATELET
Abs Immature Granulocytes: 0.01 10*3/uL (ref 0.00–0.07)
Basophils Absolute: 0 10*3/uL (ref 0.0–0.1)
Basophils Relative: 0 %
Eosinophils Absolute: 0.1 10*3/uL (ref 0.0–0.5)
Eosinophils Relative: 1 %
HCT: 38.6 % (ref 36.0–46.0)
Hemoglobin: 12.8 g/dL (ref 12.0–15.0)
Immature Granulocytes: 0 %
Lymphocytes Relative: 27 %
Lymphs Abs: 1.3 10*3/uL (ref 0.7–4.0)
MCH: 30.9 pg (ref 26.0–34.0)
MCHC: 33.2 g/dL (ref 30.0–36.0)
MCV: 93.2 fL (ref 80.0–100.0)
Monocytes Absolute: 0.4 10*3/uL (ref 0.1–1.0)
Monocytes Relative: 8 %
Neutro Abs: 3.2 10*3/uL (ref 1.7–7.7)
Neutrophils Relative %: 64 %
Platelets: 222 10*3/uL (ref 150–400)
RBC: 4.14 MIL/uL (ref 3.87–5.11)
RDW: 13.1 % (ref 11.5–15.5)
WBC: 5 10*3/uL (ref 4.0–10.5)
nRBC: 0 % (ref 0.0–0.2)

## 2020-04-11 LAB — COMPREHENSIVE METABOLIC PANEL
ALT: 14 U/L (ref 0–44)
AST: 12 U/L — ABNORMAL LOW (ref 15–41)
Albumin: 4.3 g/dL (ref 3.5–5.0)
Alkaline Phosphatase: 86 U/L (ref 38–126)
Anion gap: 9 (ref 5–15)
BUN: 17 mg/dL (ref 6–20)
CO2: 29 mmol/L (ref 22–32)
Calcium: 9.2 mg/dL (ref 8.9–10.3)
Chloride: 103 mmol/L (ref 98–111)
Creatinine, Ser: 0.66 mg/dL (ref 0.44–1.00)
GFR calc Af Amer: >60 mL/min (ref >60)
GFR calc non Af Amer: >60 mL/min (ref >60)
Glucose, Bld: 100 mg/dL — ABNORMAL HIGH (ref 70–99)
Potassium: 4 mmol/L (ref 3.5–5.1)
Sodium: 141 mmol/L (ref 135–145)
Total Bilirubin: 0.7 mg/dL (ref 0.3–1.2)
Total Protein: 7.1 g/dL (ref 6.5–8.1)

## 2020-04-11 NOTE — Telephone Encounter (Addendum)
Bilat diag mammo in October  Bone density ASAP.Marland KitchenMarland KitchenPer 04/11/20 los.   While scheduling pts appts with [facility] pt stated that she could only have appts scheduled on Friday's  Pt requested to have her Bone Density sched on 05/05/20 no sooner than that date and have her Mammo scheduled on 09/12/20. All appts dates scheduled per pt request.

## 2020-04-11 NOTE — Progress Notes (Signed)
Hematology/Oncology follow up  [facility] [phone] [phone]  Patient Care Team: Idelle Crouch, MD as PCP - General (Internal Medicine)   Name of the patient: Lacey Garcia  [id number]  05-Mar-1961   REASON FOR VISIT Follow up for right breast DCIS History of Presenting Illness-  This is a 59 year old female previously healthy who presented to clinic for initial visit for evaluation of recently diagnosed right breast DCIS. Patient had recent bilateral mammogram done on 08/04/2017 which demonstrated calcifications in the right breast. She had additional film on 08/19/2017 which demonstrated calcification in the posterior aspect of the upper outer quadrant of the right breast. Calcifications spanning an area of 8 mm and are associated with an asymmetric density. Patient had a steroid tactic biopsy on 09/01/2017. Pathology reveals high-grade DCIS, with comedonecrosis and calcifications. ER positive > 90%, PR positive>1%.  Patient denies palpating any breast mass or having any nipple discharge. She denies any breast tenderness or skin changes. She has a remote history of 2 episodes of early stage melanoma. First episode was back in 1993 and she received resection. Since then she has dermatology surveillance routinely and found out another early-stage melanoma which was resected again. She does any family history of cancer. She has been on OCP for many years until 3 years ago, she was taken off OCP and her menstrual period stopped 3 years ago. She denies any use of estrogen replacement. 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 Reports chronic back pain. Denies any cough, chest pain, abdominal pain.   # Osteopenia #  Postmenopausal, LMP 3 years ago.    Cancer Treatment # 09/27/2017  lumpectomy. Inferior and lateral margin were close at less than 1 mm. 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 Consideration for reexcision of the  inferior margin was discussed however after discussion the patient preferred not to have any more surgery. #  s/p adjuvant radiation. #  01/06/2018 started on Letrozole 2.'5mg'$  daily #  Zometa was discussed for osteoporosis prevention while on AI. She declined.   INTERVAL HISTORY Patient present for follow-up for DCIS.  She has been on Letrozole since 2019. She denies any side effects. Tolerates well.  Today she has no new complaints. No breast concerns.  She was last seen by me about 1 year ago.   Review of systems- Review of Systems  Constitutional: Negative for chills, diaphoresis, fever, malaise/fatigue and weight loss.  HENT: Negative for ear pain, hearing loss, nosebleeds, sore throat and tinnitus.   Eyes: Negative for double vision, photophobia, pain and redness.  Respiratory: Negative for cough, hemoptysis, sputum production, shortness of breath and wheezing.   Cardiovascular: Negative for chest pain, palpitations, orthopnea, claudication and leg swelling.  Gastrointestinal: Negative for abdominal pain, blood in stool, constipation, heartburn, nausea and vomiting.  Genitourinary: Negative for dysuria, hematuria and urgency.  Musculoskeletal: Negative for back pain, joint pain, myalgias and neck pain.  Skin: Negative for itching and rash.  Neurological: Negative for dizziness, tingling, tremors, sensory change and headaches.  Endo/Heme/Allergies: Negative for environmental allergies. Does not bruise/bleed easily.  Psychiatric/Behavioral: Negative for depression and hallucinations. The patient is not nervous/anxious and does not have insomnia.    Allergies  Allergen Reactions  . Sulfa Antibiotics Hives and Rash     Past Medical History:  Diagnosis Date  . Breast cancer ([place]) 08/2017   right breast  . Cancer ([place])    melanoma  . Complication of anesthesia   .  GERD (gastroesophageal reflux disease)    rare-no meds  . History of kidney stones 1991  . Melanoma ([place]) 1993    Melanoma x 2 times.   . Personal history of radiation therapy 2018   RIGHT lumpectomy  . PONV (postoperative nausea and vomiting)     Past Surgical History:  Procedure Laterality Date  . BREAST BIOPSY Right 09/01/2017   AFFIRM Stereo/intraductal carcinoma in situ  . BREAST LUMPECTOMY Right 09/27/2017   intraductal cacinoma in situ with radiation  . coloscopy  2014  . FRACTURE SURGERY    . HAND SURGERY     trigger finger release  . leg left rod and pins    . LITHOTRIPSY    . MELANOMA EXCISION     left leg and right leg  . PARTIAL MASTECTOMY WITH NEEDLE LOCALIZATION Right 09/27/2017   Procedure: PARTIAL MASTECTOMY WITH NEEDLE LOCALIZATION;  Surgeon: Leonie Green, MD;  Location: [facility] ORS;  Service: General;  Laterality: Right;  . SENTINEL NODE BIOPSY Right 09/27/2017   Procedure: SENTINEL NODE BIOPSY;  Surgeon: Leonie Green, MD;  Location: [facility] ORS;  Service: General;  Laterality: Right;    Social History   Socioeconomic History  . Marital status: Married    Spouse name: Not on file  . Number of children: Not on file  . Years of education: Not on file  . Highest education level: Not on file  Occupational History  . Not on file  Tobacco Use  . Smoking status: Never Smoker  . Smokeless tobacco: Never Used  . Tobacco comment: no tobacco use  Substance and Sexual Activity  . Alcohol use: No  . Drug use: No  . Sexual activity: Yes  Other Topics Concern  . Not on file  Social History Narrative  . Not on file   Social Determinants of Health   Financial Resource Strain:   . Difficulty of Paying Living Expenses:   Food Insecurity:   . Worried About Charity fundraiser in the Last Year:   . Arboriculturist in the Last Year:   Transportation Needs:   . Film/video editor (Medical):   Marland Kitchen Lack of Transportation (Non-Medical):   Physical Activity:   . Days of Exercise per Week:   . Minutes of Exercise per Session:   Stress:   . Feeling of Stress :    Social Connections:   . Frequency of Communication with Friends and Family:   . Frequency of Social Gatherings with Friends and Family:   . Attends Religious Services:   . Active Member of Clubs or Organizations:   . Attends Archivist Meetings:   Marland Kitchen Marital Status:   Intimate Partner Violence:   . Fear of Current or Ex-Partner:   . Emotionally Abused:   Marland Kitchen Physically Abused:   . Sexually Abused:      Family History  Problem Relation Age of Onset  . Hypertension Mother   . Heart disease Mother   . Diabetes Father   . Hypertension Father   . Heart disease Father   . Breast cancer Neg Hx      Current Outpatient Medications:  .  ascorbic acid (VITAMIN C) 500 MG tablet, Take 1 tablet (500 mg total) by mouth daily., Disp:  , Rfl:  .  cholecalciferol (VITAMIN D) 25 MCG tablet, Take 1 tablet (1,000 Units total) by mouth daily., Disp:  , Rfl:  .  Coenzyme Q10 100 MG capsule, Take 100 mg by mouth  daily., Disp: , Rfl:  .  famotidine (PEPCID) 20 MG tablet, Take 20 mg by mouth daily as needed for heartburn or indigestion., Disp: , Rfl:  .  letrozole (FEMARA) 2.5 MG tablet, TAKE 1 TABLET BY MOUTH EVERY DAY, Disp: 30 tablet, Rfl: 0 .  Melatonin 10-10 MG TBCR, Take 1 capsule by mouth every evening., Disp:  , Rfl:  .  Multiple Vitamin (MULTI-VITAMIN DAILY PO), Take 1 tablet by mouth daily. , Disp: , Rfl:  .  zinc sulfate 220 (50 Zn) MG capsule, Take 220 mg by mouth daily., Disp: , Rfl:  .  albuterol (VENTOLIN HFA) 108 (90 Base) MCG/ACT inhaler, Inhale 2 puffs into the lungs every 6 (six) hours., Disp: 6.7 g, Rfl: 0 .  cetirizine (ZYRTEC) 10 MG tablet, Take 1 tablet (10 mg total) by mouth daily., Disp:  , Rfl:  .  guaiFENesin-dextromethorphan (ROBITUSSIN DM) 100-10 MG/5ML syrup, Take 10 mLs by mouth every 4 (four) hours as needed for cough., Disp: 118 mL, Rfl: 0   Physical exam:  Vitals:   04/11/20 0943  BP: 131/69  Pulse: 72  Resp: 20  Temp: (!) 97.5 F (36.4 C)  SpO2: 99%   Weight: 171 lb 4.8 oz (77.7 kg)   Physical Exam  Constitutional: She is oriented to person, place, and time and well-developed, well-nourished, and in no distress. No distress.  HENT:  Head: Normocephalic and atraumatic.  Nose: Nose normal.  Mouth/Throat: Oropharynx is clear and moist. No oropharyngeal exudate.  Eyes: Pupils are equal, round, and reactive to light. EOM are normal. Left eye exhibits no discharge. No scleral icterus.  Cardiovascular: Normal rate and regular rhythm.  No murmur heard. Pulmonary/Chest: Effort normal and breath sounds normal. No respiratory distress. She has no wheezes. She has no rales. She exhibits no tenderness.  Abdominal: Soft. Bowel sounds are normal. She exhibits no distension and no mass. There is no abdominal tenderness. There is no rebound and no guarding.  Musculoskeletal:        General: No edema. Normal range of motion.     Cervical back: Normal range of motion and neck supple.  Lymphadenopathy:    She has no cervical adenopathy.  Neurological: She is alert and oriented to person, place, and time.  Skin: Skin is warm and dry. She is not diaphoretic. No erythema.  Psychiatric: Affect and judgment normal.    Pathology 09/27/2017. SPECIMEN SUBMITTED:  A. Breast mass, right  B. Sentinel lymph node, right  C. Breast, right; lateral margins  DIAGNOSIS:  A. RIGHT BREAST MASS; NEEDLE LOCALIZED LUMPECTOMY:  - DUCTAL CARCINOMA IN SITU, NUCLEAR GRADE 3 WITH COMEDONECROSIS AND MICROCALCIFICATIONS.  - BIOPSY SITE CHANGES, MARKER CLIP PRESENT.  - THE SURGICAL MARGINS ARE CLOSE BUT NEGATIVE (INFERIOR AND LATERAL MARGINS <1 MM).  - SEE SUMMARY BELOW.   B. RIGHT SENTINEL LYMPH NODE; EXCISION:  - NO TUMOR SEEN IN TWO LYMPH NODES (0/2).   C. RIGHT BREAST, LATERAL MARGIN;  - NO TUMOR SEEN.  - FIBROCYSTIC CHANGE.  DUCTAL CARCINOMA IN SITU OF THE BREAST:  Procedure: Needle localized lumpectomy  Specimen Laterality: Right  Size (Extent) of DCIS: at  least 36 mm  Histologic Type: Ductal carcinoma in situ (DCIS)  Nuclear Grade: 3  Necrosis: Comedo necrosis present  Margins: Close (<1 mm to inferior and lateral) but negative  Regional Lymph nodes: Uninvolved by tumor cells  Number of Lymph Nodes Examined: 2  Number of Sentinel Nodes Examined: 2  Pathologic Stage Classification (pTNM, AJCC 8th Edition):  pTis pNx  TNM Descriptors: Not applicable   BREAST BIOMARKER TESTS (performed on a separate specimen):  Estrogen Receptor (ER) Status: POSITIVE, >90% nuclear staining    Average intensity of staining: Strong  Progesterone Receptor (PgR) Status: POSITIVE, 1% nuclear staining    Average intensity of staining: Strong   Assessment and plan- Patient is a 59 y.o. 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 Pathology staging pTis pN0cM0  Cancer Staging DCIS (ductal carcinoma in situ) Staging form: Breast, AJCC 8th Edition - Clinical: No stage assigned - Unsigned - Pathologic stage from 10/05/2017: Stage 0 (pTis (DCIS), pN0, cM0, G3, ER: Positive, PR: Positive, HER2: Not assessed ) - Signed by Earlie Server, MD on 10/05/2017  1. Ductal carcinoma in situ (DCIS) of right breast   2. Osteopenia, unspecified location    # DCIS Continue letrozole 2.'5mg'$  daily. Continue for total 5 years.  Recommend patient to follow up every 6 months. She prefers to follow up once a year.  Annual mammogram bilaterally, due tin Oct 2021.   # Osteopenia, advise patient to continue taking calcium and vitamin D supplements.   She previously declined bisphosphonate treatments.    Lalo Tromp.Tasia Catchings MD 04/11/20

## 2020-04-23 ENCOUNTER — Other Ambulatory Visit: Payer: Self-pay | Admitting: *Deleted

## 2020-04-23 MED ORDER — LETROZOLE 2.5 MG PO TABS: 2.5000 mg | ORAL_TABLET | Freq: Every day | ORAL | 3 refills | Status: DC

## 2020-05-05 ENCOUNTER — Ambulatory Visit
Admission: RE | Admit: 2020-05-05 | Discharge: 2020-05-05 | Disposition: A | Discharge status: Home / Self Care | Payer: PRIVATE HEALTH INSURANCE | Source: Ambulatory Visit | Attending: Oncology | Admitting: Oncology

## 2020-05-05 DIAGNOSIS — D0511 Intraductal carcinoma in situ of right breast: Secondary | ICD-10-CM | POA: Diagnosis not present

## 2020-05-05 DIAGNOSIS — M8580 Other specified disorders of bone density and structure, unspecified site: Secondary | ICD-10-CM | POA: Insufficient documentation

## 2020-09-01 ENCOUNTER — Ambulatory Visit (INDEPENDENT_AMBULATORY_CARE_PROVIDER_SITE_OTHER): Payer: PRIVATE HEALTH INSURANCE | Admitting: Dermatology

## 2020-09-01 ENCOUNTER — Other Ambulatory Visit: Payer: Self-pay

## 2020-09-01 ENCOUNTER — Encounter: Payer: Self-pay | Admitting: Dermatology

## 2020-09-01 DIAGNOSIS — L814 Other melanin hyperpigmentation: Secondary | ICD-10-CM

## 2020-09-01 DIAGNOSIS — Z85828 Personal history of other malignant neoplasm of skin: Secondary | ICD-10-CM

## 2020-09-01 DIAGNOSIS — L918 Other hypertrophic disorders of the skin: Secondary | ICD-10-CM

## 2020-09-01 DIAGNOSIS — Z85820 Personal history of malignant melanoma of skin: Secondary | ICD-10-CM

## 2020-09-01 DIAGNOSIS — L650 Telogen effluvium: Secondary | ICD-10-CM

## 2020-09-01 DIAGNOSIS — L578 Other skin changes due to chronic exposure to nonionizing radiation: Secondary | ICD-10-CM

## 2020-09-01 DIAGNOSIS — Z86018 Personal history of other benign neoplasm: Secondary | ICD-10-CM

## 2020-09-01 DIAGNOSIS — L821 Other seborrheic keratosis: Secondary | ICD-10-CM

## 2020-09-01 DIAGNOSIS — Z1283 Encounter for screening for malignant neoplasm of skin: Secondary | ICD-10-CM

## 2020-09-01 DIAGNOSIS — D229 Melanocytic nevi, unspecified: Secondary | ICD-10-CM

## 2020-09-01 DIAGNOSIS — C4441 Basal cell carcinoma of skin of scalp and neck: Secondary | ICD-10-CM

## 2020-09-01 DIAGNOSIS — D485 Neoplasm of uncertain behavior of skin: Secondary | ICD-10-CM | POA: Diagnosis not present

## 2020-09-01 DIAGNOSIS — L820 Inflamed seborrheic keratosis: Secondary | ICD-10-CM | POA: Diagnosis not present

## 2020-09-01 DIAGNOSIS — D1800 Hemangioma unspecified site: Secondary | ICD-10-CM

## 2020-09-01 NOTE — Progress Notes (Signed)
Follow-Up Visit   Subjective  Lacey Garcia is a 59 y.o. female who presents for the following: Annual Exam (Hx MM x 2, BCC's, dysplastic nevi ) and Lesion (L neck -  5-6 weeks, red, rough, scabbed. Murlean Caller thought it was a mosquito bite, but it will not resolve). The patient presents for Total-Body Skin Exam (TBSE) for skin cancer screening and mole check.  The following portions of the chart were reviewed this encounter and updated as appropriate:  Tobacco  Allergies  Meds  Problems  Med Hx  Surg Hx  Fam Hx     Review of Systems:  No other skin or systemic complaints except as noted in HPI or Assessment and Plan.  Objective  Well appearing patient in no apparent distress; mood and affect are within normal limits.  A full examination was performed including scalp, head, eyes, ears, nose, lips, neck, chest, axillae, abdomen, back, buttocks, bilateral upper extremities, bilateral lower extremities, hands, feet, fingers, toes, fingernails, and toenails. All findings within normal limits unless otherwise noted below.  Objective  L lat neck: Pink patch 1.2 cm      Objective  R chest infraclavicular parasternal: Pink patch 0.7 cm      Objective  Forehead, back (brastrap area): Erythematous keratotic or waxy stuck-on papule or plaque.   Objective  Scalp: Diffuse thinning of hair, positive hair pull test.   Assessment & Plan  Neoplasm of uncertain behavior of skin (2) L lat neck  Skin / nail biopsy Type of biopsy: tangential   Informed consent: discussed and consent obtained   Timeout: patient name, date of birth, surgical site, and procedure verified   Procedure prep:  Patient was prepped and draped in usual sterile fashion Prep type:  Isopropyl alcohol Anesthesia: the lesion was anesthetized in a standard fashion   Anesthetic:  1% lidocaine w/ epinephrine 1-100,000 buffered w/ 8.4% NaHCO3 Instrument used: flexible razor blade   Hemostasis achieved with:  pressure, aluminum chloride and electrodesiccation   Outcome: patient tolerated procedure well   Post-procedure details: sterile dressing applied and wound care instructions given   Dressing type: bandage and petrolatum    Specimen 1 - Surgical pathology Differential Diagnosis:D48.5 r/o BCC vs other  Check Margins: No Pink patch 1.2 cm  R chest infraclavicular parasternal  Skin / nail biopsy Type of biopsy: tangential   Informed consent: discussed and consent obtained   Timeout: patient name, date of birth, surgical site, and procedure verified   Procedure prep:  Patient was prepped and draped in usual sterile fashion Prep type:  Isopropyl alcohol Anesthesia: the lesion was anesthetized in a standard fashion   Anesthetic:  1% lidocaine w/ epinephrine 1-100,000 buffered w/ 8.4% NaHCO3 Instrument used: flexible razor blade   Hemostasis achieved with: pressure, aluminum chloride and electrodesiccation   Outcome: patient tolerated procedure well   Post-procedure details: sterile dressing applied and wound care instructions given   Dressing type: bandage and petrolatum    Specimen 2 - Surgical pathology Differential Diagnosis:D48.5 r/o BCC vs other Check Margins: No Pink patch 0.7 cm  Inflamed seborrheic keratosis Forehead, back (brastrap area) Patient declines treatment today since she is having photos made tomorrow.   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  May have several causes, but COVID likely the reason in this case. Due to [place] patient that hair will regrow and typical takes 6-12 months to recover from hair loss. Continue Viviscal hair supplements daily.  Other options are Rogaine; Biotin;  Red light treatments (HairMax; Revian)   Lentigines - Scattered tan macules - Discussed due to sun exposure - Benign, observe - Call for any changes  Seborrheic Keratoses - Stuck-on, waxy, tan-brown papules and plaques  - Discussed benign etiology  and prognosis. - Observe - Call for any changes  Melanocytic Nevi - Tan-brown and/or pink-flesh-colored symmetric macules and papules - Benign appearing on exam today - Observation - Call clinic for new or changing moles - Recommend daily use of broad spectrum spf 30+ sunscreen to sun-exposed areas.   Hemangiomas - Red papules - Discussed benign nature - Observe - Call for any changes  Actinic Damage - diffuse scaly erythematous macules with underlying dyspigmentation - Recommend daily broad spectrum sunscreen SPF 30+ to sun-exposed areas, reapply every 2 hours as needed.  - Call for new or changing lesions.  History of Basal Cell Carcinoma of the Skin - No evidence of recurrence today - Recommend regular full body skin exams - Recommend daily broad spectrum sunscreen SPF 30+ to sun-exposed areas, reapply every 2 hours as needed.  - Call if any new or changing lesions are noted between office visits  History of Dysplastic Nevi - No evidence of recurrence today - Recommend regular full body skin exams - Recommend daily broad spectrum sunscreen SPF 30+ to sun-exposed areas, reapply every 2 hours as needed.  - Call if any new or changing lesions are noted between office visits  History of Melanoma - No evidence of recurrence today - No lymphadenopathy - Recommend regular full body skin exams - Recommend daily broad spectrum sunscreen SPF 30+ to sun-exposed areas, reapply every 2 hours as needed.  - Call if any new or changing lesions are noted between office visits  Acrochordons (Skin Tags) - Fleshy, skin-colored pedunculated papules - Benign appearing.  - Observe. - If desired, they can be removed with an in office procedure that is not covered by insurance. - Please call the clinic if you notice any new or changing lesions.  Skin cancer screening performed today.  Return in about 1 year (around 09/01/2021) for TBSE; to be scheduled sooner for ISK tx.  I, Rudell Cobb, CMA, am acting as scribe for Sarina Ser, MD .  Documentation: I have reviewed the above documentation for accuracy and completeness, and I agree with the above.  Sarina Ser, MD

## 2020-09-01 NOTE — Patient Instructions (Signed)

## 2020-09-02 ENCOUNTER — Encounter: Payer: Self-pay | Admitting: Dermatology

## 2020-09-08 ENCOUNTER — Telehealth: Payer: Self-pay

## 2020-09-08 NOTE — Telephone Encounter (Signed)
-----   Message from Ralene Bathe, MD sent at 09/05/2020  9:59 AM EDT ----- Diagnosis 1. Skin , left lat neck BASAL CELL CARCINOMA, NODULAR PATTERN 2. Skin , right chest infraclavicular parasternal ACTINIC KERATOSIS WITH FEATURES OF A VERRUCA  1- Cancer - BCC Schedule for treatment (EDC) 2- PreCancer  Schedule for treatment (Ln2)

## 2020-09-08 NOTE — Telephone Encounter (Signed)
Patient advised of BX results by MyChart and by phone. She is scheduled for a follow up on 10/09/20.

## 2020-09-12 ENCOUNTER — Ambulatory Visit
Admission: RE | Admit: 2020-09-12 | Discharge: 2020-09-12 | Disposition: A | Discharge status: Home / Self Care | Payer: PRIVATE HEALTH INSURANCE | Source: Ambulatory Visit | Attending: Oncology | Admitting: Oncology

## 2020-09-12 ENCOUNTER — Other Ambulatory Visit: Payer: Self-pay

## 2020-09-12 DIAGNOSIS — D0511 Intraductal carcinoma in situ of right breast: Secondary | ICD-10-CM | POA: Diagnosis not present

## 2020-10-09 ENCOUNTER — Encounter: Payer: Self-pay | Admitting: Dermatology

## 2020-10-09 ENCOUNTER — Ambulatory Visit (INDEPENDENT_AMBULATORY_CARE_PROVIDER_SITE_OTHER): Payer: PRIVATE HEALTH INSURANCE | Admitting: Dermatology

## 2020-10-09 ENCOUNTER — Other Ambulatory Visit: Payer: Self-pay

## 2020-10-09 DIAGNOSIS — L578 Other skin changes due to chronic exposure to nonionizing radiation: Secondary | ICD-10-CM

## 2020-10-09 DIAGNOSIS — L570 Actinic keratosis: Secondary | ICD-10-CM

## 2020-10-09 DIAGNOSIS — C4441 Basal cell carcinoma of skin of scalp and neck: Secondary | ICD-10-CM | POA: Diagnosis not present

## 2020-10-09 DIAGNOSIS — L820 Inflamed seborrheic keratosis: Secondary | ICD-10-CM | POA: Diagnosis not present

## 2020-10-09 NOTE — Progress Notes (Signed)
Follow-Up Visit   Subjective  Lacey Garcia is a 59 y.o. female who presents for the following: Actinic Keratosis (Pt here for treatment of biopsy proven Ak at R chest infraclavicular parasteranal removed 09/01/20), Seborrheic Keratosis (Pt c/o irritated itchy spots on her forehead,back,arms,legs she would like removed today ), and Skin Cancer (Pt here for treatment of biospy proven BCC nodular pattern at L lateral neck removed 09/01/20).  The following portions of the chart were reviewed this encounter and updated as appropriate:  Tobacco  Allergies  Meds  Problems  Med Hx  Surg Hx  Fam Hx     Review of Systems:  No other skin or systemic complaints except as noted in HPI or Assessment and Plan.  Objective  Well appearing patient in no apparent distress; mood and affect are within normal limits.  A focused examination was performed including face, neck, chest and back. Relevant physical exam findings are noted in the Assessment and Plan.  Objective  L lateral neck: 1.2 cm Pink pearly papule or plaque with arborizing vessels.   Objective  R chest infraclavicular parasternal x 1, nose x2 (3): Erythematous thin papules/macules with gritty scale.   Objective  forehead x 4, L elbow x 2, R thigh x 1, R popliteal x 1, back x 3 (11): Erythematous keratotic or waxy stuck-on papule or plaque.    Assessment & Plan  Basal cell carcinoma (BCC) of skin of neck L lateral neck  Destruction of lesion Complexity: extensive   Destruction method: electrodesiccation and curettage   Informed consent: discussed and consent obtained   Timeout:  patient name, date of birth, surgical site, and procedure verified Procedure prep:  Patient was prepped and draped in usual sterile fashion Prep type:  Isopropyl alcohol Anesthesia: the lesion was anesthetized in a standard fashion   Anesthetic:  1% lidocaine w/ epinephrine 1-100,000 buffered w/ 8.4% NaHCO3 Curettage performed in three different  directions: Yes   Electrodesiccation performed over the curetted area: Yes   Lesion length (cm):  1.2 Lesion width (cm):  1.2 Margin per side (cm):  0.2 Final wound size (cm):  1.6 Hemostasis achieved with:  pressure, aluminum chloride and electrodesiccation Outcome: patient tolerated procedure well with no complications   Post-procedure details: sterile dressing applied and wound care instructions given   Dressing type: bandage and petrolatum    AK (actinic keratosis) (3) R chest infraclavicular parasternal x 1, nose x2  Biopsy proven AK R chest infraclavicular parasternal treated with Ln2 today   Destruction of lesion - R chest infraclavicular parasternal x 1, nose x2 Complexity: simple   Destruction method: cryotherapy   Informed consent: discussed and consent obtained   Timeout:  patient name, date of birth, surgical site, and procedure verified Lesion destroyed using liquid nitrogen: Yes   Region frozen until ice ball extended beyond lesion: Yes   Outcome: patient tolerated procedure well with no complications   Post-procedure details: wound care instructions given    Inflamed seborrheic keratosis (11) forehead x 4, L elbow x 2, R thigh x 1, R popliteal x 1, back x 3  Destruction of lesion - forehead x 4, L elbow x 2, R thigh x 1, R popliteal x 1, back x 3 Complexity: simple   Destruction method: cryotherapy   Informed consent: discussed and consent obtained   Timeout:  patient name, date of birth, surgical site, and procedure verified Lesion destroyed using liquid nitrogen: Yes   Region frozen until ice ball extended beyond lesion: Yes  Outcome: patient tolerated procedure well with no complications   Post-procedure details: wound care instructions given    Actinic Damage - chronic, secondary to cumulative UV radiation exposure/sun exposure over time - diffuse scaly erythematous macules with underlying dyspigmentation - Recommend daily broad spectrum sunscreen SPF 30+  to sun-exposed areas, reapply every 2 hours as needed.  - Call for new or changing lesions.  Return in about 1 year (around 10/09/2021).  IMarye Round, CMA, am acting as scribe for Sarina Ser, MD .  Documentation: I have reviewed the above documentation for accuracy and completeness, and I agree with the above.  Sarina Ser, MD

## 2020-10-09 NOTE — Patient Instructions (Signed)

## 2020-10-15 ENCOUNTER — Encounter: Payer: Self-pay | Admitting: Dermatology

## 2020-10-21 DIAGNOSIS — E559 Vitamin D deficiency, unspecified: Secondary | ICD-10-CM | POA: Insufficient documentation

## 2021-02-24 ENCOUNTER — Telehealth: Payer: Self-pay | Admitting: *Deleted

## 2021-02-24 NOTE — Telephone Encounter (Signed)
FYI....   Lab/MD (breast exam) in 1 year.. R/S from 04/17/21 to 06/05/21..Date per pt requst due to her sched, pt also stated that she had a breast exam on 02/19/21.Marland Kitchen

## 2021-03-05 ENCOUNTER — Other Ambulatory Visit: Payer: Self-pay | Admitting: Oncology

## 2021-04-17 ENCOUNTER — Ambulatory Visit: Payer: PRIVATE HEALTH INSURANCE | Admitting: Oncology

## 2021-04-17 ENCOUNTER — Other Ambulatory Visit: Payer: PRIVATE HEALTH INSURANCE

## 2021-06-04 ENCOUNTER — Other Ambulatory Visit: Payer: Self-pay

## 2021-06-04 DIAGNOSIS — D0511 Intraductal carcinoma in situ of right breast: Secondary | ICD-10-CM

## 2021-06-05 ENCOUNTER — Other Ambulatory Visit: Payer: Self-pay

## 2021-06-05 ENCOUNTER — Encounter: Payer: Self-pay | Admitting: Oncology

## 2021-06-05 ENCOUNTER — Inpatient Hospital Stay (HOSPITAL_BASED_OUTPATIENT_CLINIC_OR_DEPARTMENT_OTHER): Payer: PRIVATE HEALTH INSURANCE | Admitting: Oncology

## 2021-06-05 ENCOUNTER — Inpatient Hospital Stay: Payer: PRIVATE HEALTH INSURANCE | Attending: Oncology

## 2021-06-05 VITALS — BP 138/62 | HR 77 | Temp 98.0°F | Resp 18 | Wt 175.3 lb

## 2021-06-05 DIAGNOSIS — Z803 Family history of malignant neoplasm of breast: Secondary | ICD-10-CM | POA: Insufficient documentation

## 2021-06-05 DIAGNOSIS — Z79811 Long term (current) use of aromatase inhibitors: Secondary | ICD-10-CM | POA: Insufficient documentation

## 2021-06-05 DIAGNOSIS — M8580 Other specified disorders of bone density and structure, unspecified site: Secondary | ICD-10-CM | POA: Diagnosis not present

## 2021-06-05 DIAGNOSIS — D0511 Intraductal carcinoma in situ of right breast: Secondary | ICD-10-CM | POA: Diagnosis not present

## 2021-06-05 DIAGNOSIS — G8929 Other chronic pain: Secondary | ICD-10-CM | POA: Insufficient documentation

## 2021-06-05 DIAGNOSIS — Z79899 Other long term (current) drug therapy: Secondary | ICD-10-CM | POA: Insufficient documentation

## 2021-06-05 DIAGNOSIS — Z170 Estrogen receptor positive status [ER+]: Secondary | ICD-10-CM | POA: Insufficient documentation

## 2021-06-05 DIAGNOSIS — Z923 Personal history of irradiation: Secondary | ICD-10-CM | POA: Diagnosis not present

## 2021-06-05 DIAGNOSIS — M549 Dorsalgia, unspecified: Secondary | ICD-10-CM | POA: Diagnosis not present

## 2021-06-05 LAB — CBC WITH DIFFERENTIAL/PLATELET
Abs Immature Granulocytes: 0.01 10*3/uL (ref 0.00–0.07)
Basophils Absolute: 0 10*3/uL (ref 0.0–0.1)
Basophils Relative: 0 %
Eosinophils Absolute: 0.1 10*3/uL (ref 0.0–0.5)
Eosinophils Relative: 1 %
HCT: 38.3 % (ref 36.0–46.0)
Hemoglobin: 12.7 g/dL (ref 12.0–15.0)
Immature Granulocytes: 0 %
Lymphocytes Relative: 31 %
Lymphs Abs: 1.8 10*3/uL (ref 0.7–4.0)
MCH: 31.2 pg (ref 26.0–34.0)
MCHC: 33.2 g/dL (ref 30.0–36.0)
MCV: 94.1 fL (ref 80.0–100.0)
Monocytes Absolute: 0.5 10*3/uL (ref 0.1–1.0)
Monocytes Relative: 8 %
Neutro Abs: 3.4 10*3/uL (ref 1.7–7.7)
Neutrophils Relative %: 60 %
Platelets: 229 10*3/uL (ref 150–400)
RBC: 4.07 MIL/uL (ref 3.87–5.11)
RDW: 13.3 % (ref 11.5–15.5)
WBC: 5.8 10*3/uL (ref 4.0–10.5)
nRBC: 0 % (ref 0.0–0.2)

## 2021-06-05 LAB — COMPREHENSIVE METABOLIC PANEL
ALT: 13 U/L (ref 0–44)
AST: 14 U/L — ABNORMAL LOW (ref 15–41)
Albumin: 4.4 g/dL (ref 3.5–5.0)
Alkaline Phosphatase: 100 U/L (ref 38–126)
Anion gap: 10 (ref 5–15)
BUN: 19 mg/dL (ref 6–20)
CO2: 27 mmol/L (ref 22–32)
Calcium: 9.2 mg/dL (ref 8.9–10.3)
Chloride: 101 mmol/L (ref 98–111)
Creatinine, Ser: 0.65 mg/dL (ref 0.44–1.00)
GFR, Estimated: >60 mL/min (ref >60)
Glucose, Bld: 98 mg/dL (ref 70–99)
Potassium: 4.1 mmol/L (ref 3.5–5.1)
Sodium: 138 mmol/L (ref 135–145)
Total Bilirubin: 0.7 mg/dL (ref 0.3–1.2)
Total Protein: 6.9 g/dL (ref 6.5–8.1)

## 2021-06-05 MED ORDER — LETROZOLE 2.5 MG PO TABS: 2.5000 mg | ORAL_TABLET | Freq: Every day | ORAL | 11 refills | Status: DC

## 2021-06-05 NOTE — Progress Notes (Signed)
No complaints or concerns today 

## 2021-06-06 NOTE — Progress Notes (Signed)
Hematology/Oncology follow up  [facility] [phone] [phone]  Patient Care Team: Idelle Crouch, MD as PCP - General (Internal Medicine)   Name of the patient: Lacey Garcia  [id number]  [birth year]   REASON FOR VISIT Follow up for right breast DCIS History of Presenting Illness-  This is a 60 year old female previously healthy who presented to clinic for initial visit for evaluation of recently diagnosed right breast DCIS. Patient had recent bilateral mammogram done on 08/04/2017 which demonstrated calcifications in the right breast. She had additional film on 08/19/2017 which demonstrated calcification in the posterior aspect of the upper outer quadrant of the right breast. Calcifications spanning an area of 8 mm and are associated with an asymmetric density. Patient had a steroid tactic biopsy on 09/01/2017. Pathology reveals high-grade DCIS, with comedonecrosis and calcifications. ER positive > 90%, PR positive>1%.  Patient denies palpating any breast mass or having any nipple discharge. She denies any breast tenderness or skin changes. She has a remote history of 2 episodes of early stage melanoma. First episode was back in 1993 and she received resection. Since then she has dermatology surveillance routinely and found out another early-stage melanoma which was resected again. She does any family history of cancer. She has been on OCP for many years until 3 years ago, she was taken off OCP and her menstrual period stopped 3 years ago. She denies any use of estrogen replacement. 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 Reports chronic back pain. Denies any cough, chest pain, abdominal pain.   # postmenopausal.  # Osteopenia   Cancer Treatment # 09/27/2017  lumpectomy. Inferior and lateral margin were close at less than 1 mm. 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 Consideration for reexcision of the inferior margin  was discussed however after discussion the patient preferred not to have any more surgery. #  s/p adjuvant radiation. #  01/06/2018 started on Letrozole 2.$RemoveBefore'5mg'vSpJqJodetQJy$  daily #  Zometa was discussed for osteoporosis prevention while on AI. She declined.   INTERVAL HISTORY Patient present for follow-up for DCIS.  She has been on Letrozole since 2019. She tolerates well, no new complaints.   Review of systems- Review of Systems  Constitutional:  Negative for chills, diaphoresis, fever, malaise/fatigue and weight loss.  HENT:  Negative for ear pain, hearing loss, nosebleeds, sore throat and tinnitus.   Eyes:  Negative for double vision, photophobia, pain and redness.  Respiratory:  Negative for cough, hemoptysis, sputum production, shortness of breath and wheezing.   Cardiovascular:  Negative for chest pain, palpitations, orthopnea, claudication and leg swelling.  Gastrointestinal:  Negative for abdominal pain, blood in stool, constipation, heartburn, nausea and vomiting.  Genitourinary:  Negative for dysuria, hematuria and urgency.  Musculoskeletal:  Negative for back pain, joint pain, myalgias and neck pain.  Skin:  Negative for itching and rash.  Neurological:  Negative for dizziness, tingling, tremors, sensory change and headaches.  Endo/Heme/Allergies:  Negative for environmental allergies. Does not bruise/bleed easily.  Psychiatric/Behavioral:  Negative for depression and hallucinations. The patient is not nervous/anxious and does not have insomnia.   Allergies  Allergen Reactions   Sulfa Antibiotics Hives and Rash     Past Medical History:  Diagnosis Date   Basal cell carcinoma 05/22/2008   Right med. post. thigh. Superficial.    Basal cell carcinoma 09/01/2020   L lateral neck    Breast cancer ([place]) 08/2017   right breast   Cancer (  [place])    melanoma   Complication of anesthesia    Dysplastic nevus 07/11/2007   Right sup. med. pretibial. Slight to moderate atypia, close to margin.     Dysplastic nevus 07/11/2007   Left mid med. thigh. Moderate atypia, close to margin.   Dysplastic nevus 02/14/2008   Left distal tricep. Slight to moderate atypia, close to edge.    Dysplastic nevus 02/14/2008   Right medial knee. Slight to moderate atypia, close to edge.    Dysplastic nevus 02/14/2008   Left sacral superior buttock area. Moderate atypia, extends to one edge.    Dysplastic nevus 05/22/2008   Right lateral inframammary. Slight to moderate atypia, margins involved.    Dysplastic nevus 05/22/2008   Left med. tricep, middle. Slight to moderate atypia, margins involved.    Dysplastic nevus 09/17/2008   Left tricep distal. Moderate atypia, close to margin.    Dysplastic nevus 09/09/2009   Left sup. ant. lat. thigh. Moderate to severe atypia, close to margins. Excised 11/12/2009, margins free.    Dysplastic nevus 09/09/2009   Left sup. lat. thigh. Moderate atypia, margins involved.    Dysplastic nevus 09/09/2009   Right mid ant. thigh. Moderate to severe atypia, edge involved. Excised 10/29/2009, margins free.    Dysplastic nevus 02/17/2010   Right med. knee. Mild to moderate atypia, limited margins free.    Dysplastic nevus 02/17/2010   Left. sup. lat. thigh near buttock. Mild to moderate atypia, limited margins free.    Dysplastic nevus 02/17/2010   Left volar forearm. Mild to moderate atypia, limited margins free.    Dysplastic nevus 08/31/2010   Left sup. tricep. Moderate atypia, close to margin.   Dysplastic nevus 08/31/2010   Right sup. lat. tricep. Moderate atypia, limited margins free.    Dysplastic nevus 10/15/2010   Left lat. hip near buttocks. Mild to moderate atypia, limited margins free.    Dysplastic nevus 10/15/2010   Right ant. lat. deltoid. Minimal atypia, edges free.    GERD (gastroesophageal reflux disease)    rare-no meds   History of kidney stones 1991   Melanoma ([place]) 1993   Melanoma x 2 times. - L thigh    Melanoma ([place]) 07/11/2007   Right mid  med. thigh. Superficial spreading, Clark's level II, Breslow's 0.7mm. Excised 07/19/2007, Margins free.    Personal history of radiation therapy 2018   RIGHT lumpectomy   PONV (postoperative nausea and vomiting)     Past Surgical History:  Procedure Laterality Date   BREAST BIOPSY Right 09/01/2017   AFFIRM Stereo/intraductal carcinoma in situ   BREAST LUMPECTOMY Right 09/27/2017   intraductal cacinoma in situ with radiation   coloscopy  2014   FRACTURE SURGERY     HAND SURGERY     trigger finger release   leg left rod and pins     LITHOTRIPSY     MELANOMA EXCISION     left leg and right leg   PARTIAL MASTECTOMY WITH NEEDLE LOCALIZATION Right 09/27/2017   Procedure: PARTIAL MASTECTOMY WITH NEEDLE LOCALIZATION;  Surgeon: Leonie Green, MD;  Location: [facility] ORS;  Service: General;  Laterality: Right;   SENTINEL NODE BIOPSY Right 09/27/2017   Procedure: SENTINEL NODE BIOPSY;  Surgeon: Leonie Green, MD;  Location: [facility] ORS;  Service: General;  Laterality: Right;    Social History   Socioeconomic History   Marital status: Married    Spouse name: Not on file   Number of children: Not on file   Years of education:  Not on file   Highest education level: Not on file  Occupational History   Not on file  Tobacco Use   Smoking status: Never   Smokeless tobacco: Never   Tobacco comments:    no tobacco use  Vaping Use   Vaping Use: Never used  Substance and Sexual Activity   Alcohol use: No   Drug use: No   Sexual activity: Yes  Other Topics Concern   Not on file  Social History Narrative   Not on file   Social Determinants of Health   Financial Resource Strain: Not on file  Food Insecurity: Not on file  Transportation Needs: Not on file  Physical Activity: Not on file  Stress: Not on file  Social Connections: Not on file  Intimate Partner Violence: Not on file     Family History  Problem Relation Age of Onset   Hypertension Mother    Heart disease  Mother    Diabetes Father    Hypertension Father    Heart disease Father    Breast cancer Neg Hx      Current Outpatient Medications:    ascorbic acid (VITAMIN C) 500 MG tablet, Take 1 tablet (500 mg total) by mouth daily., Disp:  , Rfl:    calcium citrate-vitamin D (CITRACAL+D) 315-200 MG-UNIT tablet, Take 1 tablet by mouth 2 (two) times daily., Disp: , Rfl:    cholecalciferol (VITAMIN D) 25 MCG tablet, Take 1 tablet (1,000 Units total) by mouth daily., Disp:  , Rfl:    Coenzyme Q10 100 MG capsule, Take 100 mg by mouth daily., Disp: , Rfl:    magnesium oxide (MAG-OX) 400 MG tablet, Take 400 mg by mouth daily., Disp: , Rfl:    Multiple Vitamin (MULTI-VITAMIN DAILY PO), Take 1 tablet by mouth daily. , Disp: , Rfl:    Omega-3 Fatty Acids (FISH OIL) 1000 MG CAPS, Take by mouth., Disp: , Rfl:    letrozole (FEMARA) 2.5 MG tablet, Take 1 tablet (2.5 mg total) by mouth daily., Disp: 30 tablet, Rfl: 11   Physical exam:  Vitals:   06/05/21 1009  BP: 138/62  Pulse: 77  Resp: 18  Temp: 98 F (36.7 C)  SpO2: 100%  Weight: 175 lb 4.8 oz (79.5 kg)   Physical Exam Constitutional:      General: She is not in acute distress.    Appearance: She is not diaphoretic.  HENT:     Head: Normocephalic and atraumatic.     Nose: Nose normal.     Mouth/Throat:     Pharynx: No oropharyngeal exudate.  Eyes:     General: No scleral icterus.       Left eye: No discharge.     Pupils: Pupils are equal, round, and reactive to light.  Cardiovascular:     Rate and Rhythm: Normal rate and regular rhythm.     Heart sounds: No murmur heard. Pulmonary:     Effort: Pulmonary effort is normal. No respiratory distress.     Breath sounds: Normal breath sounds. No wheezing or rales.  Chest:     Chest wall: No tenderness.  Abdominal:     General: Bowel sounds are normal. There is no distension.     Palpations: Abdomen is soft. There is no mass.     Tenderness: There is no abdominal tenderness. There is no  guarding or rebound.  Musculoskeletal:        General: Normal range of motion.     Cervical back: Normal  range of motion and neck supple.  Lymphadenopathy:     Cervical: No cervical adenopathy.  Skin:    General: Skin is warm and dry.     Findings: No erythema.  Neurological:     Mental Status: She is alert and oriented to person, place, and time.  Psychiatric:        Mood and Affect: Affect normal.        Judgment: Judgment normal.   Breast exam was performed in seated and lying down position. History of right lumpectomy with a well-healed surgical scar. No palpable breast mass bilaterally. No evidence of right axillary adenopathy   Pathology 09/27/2017. SPECIMEN SUBMITTED:  A. Breast mass, right  B. Sentinel lymph node, right  C. Breast, right; lateral margins  DIAGNOSIS:  A.  RIGHT BREAST MASS; NEEDLE LOCALIZED LUMPECTOMY:  - DUCTAL CARCINOMA IN SITU, NUCLEAR GRADE 3 WITH COMEDONECROSIS AND MICROCALCIFICATIONS.  - BIOPSY SITE CHANGES, MARKER CLIP PRESENT.  - THE SURGICAL MARGINS ARE CLOSE BUT NEGATIVE (INFERIOR AND LATERAL MARGINS <1 MM).  - SEE SUMMARY BELOW.   B.  RIGHT SENTINEL LYMPH NODE; EXCISION:  - NO TUMOR SEEN IN TWO LYMPH NODES (0/2).   C.  RIGHT BREAST, LATERAL MARGIN;  - NO TUMOR SEEN.  - FIBROCYSTIC CHANGE.  DUCTAL CARCINOMA IN SITU OF THE BREAST:  Procedure: Needle localized lumpectomy  Specimen Laterality:  Right  Size (Extent) of DCIS:  at least 36 mm  Histologic Type: Ductal carcinoma in situ (DCIS)  Nuclear Grade: 3  Necrosis: Comedo necrosis present  Margins:  Close (<1 mm to inferior and lateral) but negative  Regional Lymph nodes: Uninvolved by tumor cells  Number of Lymph Nodes Examined: 2  Number of Sentinel Nodes Examined: 2  Pathologic Stage Classification (pTNM, AJCC 8th Edition): pTis  pNx  TNM Descriptors: Not applicable   BREAST BIOMARKER TESTS (performed on a separate specimen):  Estrogen Receptor (ER) Status: POSITIVE, >90% nuclear  staining       Average intensity of staining: Strong  Progesterone Receptor (PgR) Status: POSITIVE, 1% nuclear staining       Average intensity of staining: Strong   Assessment and plan- Patient is a 60 y.o. 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 Pathology staging pTis pN0cM0  Cancer Staging DCIS (ductal carcinoma in situ) Staging form: Breast, AJCC 8th Edition - Clinical: No stage assigned - Unsigned - Pathologic stage from 10/05/2017: Stage 0 (pTis (DCIS), pN0, cM0, G3, ER+, PR+, HER2: Not Assessed) - Signed by Earlie Server, MD on 10/05/2017 Histologic grading system: 3 grade system  1. Ductal carcinoma in situ (DCIS) of right breast   2. Osteopenia, unspecified location   3. Aromatase inhibitor use    # History of high grade DCIS of right breast She is doing well clinically.  Labs are reviewed and discussed with patient. Recommend her to finish 5 years of letrozole 2.$RemoveBefore'5mg'[id number]$  daily [ till March 2024] Recommend patient to follow up every 6 months. She prefers to follow up once a year. - Rx was refilled Annual mammogram bilaterally, will obtain in November 2022   # Osteopenia, continue taking calcium and vitamin D supplements.   She previously declined bisphosphonate treatments.    Ayrton Mcvay.Tasia Catchings MD 06/06/21

## 2021-08-20 ENCOUNTER — Ambulatory Visit (INDEPENDENT_AMBULATORY_CARE_PROVIDER_SITE_OTHER): Payer: PRIVATE HEALTH INSURANCE | Admitting: Dermatology

## 2021-08-20 ENCOUNTER — Other Ambulatory Visit: Payer: Self-pay

## 2021-08-20 DIAGNOSIS — L578 Other skin changes due to chronic exposure to nonionizing radiation: Secondary | ICD-10-CM | POA: Diagnosis not present

## 2021-08-20 DIAGNOSIS — Z85828 Personal history of other malignant neoplasm of skin: Secondary | ICD-10-CM

## 2021-08-20 DIAGNOSIS — L821 Other seborrheic keratosis: Secondary | ICD-10-CM

## 2021-08-20 DIAGNOSIS — Z85820 Personal history of malignant melanoma of skin: Secondary | ICD-10-CM

## 2021-08-20 DIAGNOSIS — L820 Inflamed seborrheic keratosis: Secondary | ICD-10-CM | POA: Diagnosis not present

## 2021-08-20 DIAGNOSIS — D1800 Hemangioma unspecified site: Secondary | ICD-10-CM

## 2021-08-20 DIAGNOSIS — Z86018 Personal history of other benign neoplasm: Secondary | ICD-10-CM

## 2021-08-20 DIAGNOSIS — Z1283 Encounter for screening for malignant neoplasm of skin: Secondary | ICD-10-CM

## 2021-08-20 DIAGNOSIS — L573 Poikiloderma of Civatte: Secondary | ICD-10-CM

## 2021-08-20 DIAGNOSIS — D229 Melanocytic nevi, unspecified: Secondary | ICD-10-CM

## 2021-08-20 DIAGNOSIS — L309 Dermatitis, unspecified: Secondary | ICD-10-CM

## 2021-08-20 DIAGNOSIS — L814 Other melanin hyperpigmentation: Secondary | ICD-10-CM

## 2021-08-20 MED ORDER — EUCRISA 2 % EX OINT: TOPICAL_OINTMENT | CUTANEOUS | 2 refills | Status: DC

## 2021-08-20 NOTE — Progress Notes (Signed)
Follow-Up Visit   Subjective  Lacey Garcia is a 60 y.o. female who presents for the following: Annual Exam. Yearly mole check hx of Melanoma, hx of Dysplastic nevus, Hx of BCC The patient presents for Total-Body Skin Exam (TBSE) for skin cancer screening and mole check.   The following portions of the chart were reviewed this encounter and updated as appropriate:   Tobacco  Allergies  Meds  Problems  Med Hx  Surg Hx  Fam Hx     Review of Systems:  No other skin or systemic complaints except as noted in HPI or Assessment and Plan.  Objective  Well appearing patient in no apparent distress; mood and affect are within normal limits.  A full examination was performed including scalp, head, eyes, ears, nose, lips, neck, chest, axillae, abdomen, back, buttocks, bilateral upper extremities, bilateral lower extremities, hands, feet, fingers, toes, fingernails, and toenails. All findings within normal limits unless otherwise noted below.  Neck - Anterior Sun damaged skin   Right Hand - Anterior Pink patches   left medial pretibial, right posterior thigh  (2) (2) Erythematous keratotic or waxy stuck-on papule or plaque.    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 Otherwise no treatment needed.  Hand dermatitis Right Hand - Anterior dermatitis (eczema) is a chronic, relapsing, pruritic condition that can significantly affect quality of life. It is often associated with allergic rhinitis and/or asthma and can require treatment with topical medications, phototherapy, or in severe cases a biologic medication called Dupixent in children and adults.    Start Eucrisa ointment apply to hands twice a day   Related Medications Crisaborole (EUCRISA) 2 % OINT Apply to affected hand qd-bid  Inflamed seborrheic keratosis left medial pretibial, right posterior thigh  (2)  Destruction  of lesion - left medial pretibial, right posterior thigh  (2) Complexity: simple   Destruction method: cryotherapy   Informed consent: discussed and consent obtained   Timeout:  patient name, date of birth, surgical site, and procedure verified Lesion destroyed using liquid nitrogen: Yes   Region frozen until ice ball extended beyond lesion: Yes   Outcome: patient tolerated procedure well with no complications   Post-procedure details: wound care instructions given    Skin cancer screening  Lentigines - Scattered tan macules - Due to sun exposure - Benign-appearing, observe - Recommend daily broad spectrum sunscreen SPF 30+ to sun-exposed areas, reapply every 2 hours as needed. - Call for any changes  Seborrheic Keratoses - Stuck-on, waxy, tan-brown papules and/or plaques  - Benign-appearing - Discussed benign etiology and prognosis. - Observe - Call for any changes  Melanocytic Nevi - Tan-brown and/or pink-flesh-colored symmetric macules and papules - Benign appearing on exam today - Observation - Call clinic for new or changing moles - Recommend daily use of broad spectrum spf 30+ sunscreen to sun-exposed areas.   Hemangiomas - Red papules - Discussed benign nature - Observe - Call for any changes  Actinic Damage - Chronic condition, secondary to cumulative UV/sun exposure - diffuse scaly erythematous macules with underlying dyspigmentation - Recommend daily broad spectrum sunscreen SPF 30+ to sun-exposed areas, reapply every 2 hours as needed.  - Staying in the shade or wearing long sleeves, sun glasses (UVA+UVB protection) and wide brim hats (4-inch brim around the entire circumference of the hat) are also recommended for sun protection.  - Call for new or changing lesions.  History of Dysplastic  Nevi Multiple see history - No evidence of recurrence today - Recommend regular full body skin exams - Recommend daily broad spectrum sunscreen SPF 30+ to sun-exposed  areas, reapply every 2 hours as needed.  - Call if any new or changing lesions are noted between office visits   History of Basal Cell Carcinoma of the Skin Multiple see history  - No evidence of recurrence today - Recommend regular full body skin exams - Recommend daily broad spectrum sunscreen SPF 30+ to sun-exposed areas, reapply every 2 hours as needed.  - Call if any new or changing lesions are noted between office visits   History of Melanoma Left thigh x 2- 1993, right mid med thigh-2008 - No evidence of recurrence today - No lymphadenopathy - Recommend regular full body skin exams - Recommend daily broad spectrum sunscreen SPF 30+ to sun-exposed areas, reapply every 2 hours as needed.  - Call if any new or changing lesions are noted between office visits   Skin cancer screening performed today.   Return in about 1 year (around 08/20/2022) for TBSE, hx of Melanoma .  IMarye Round, CMA, am acting as scribe for Sarina Ser, MD .  Documentation: I have reviewed the above documentation for accuracy and completeness, and I agree with the above.  Sarina Ser, MD

## 2021-08-20 NOTE — Patient Instructions (Addendum)
Recommend Niacinamide or Nicotinamide 500mg  twice per day to lower risk of non-melanoma skin cancer by approximately 25%. This is usually available at [employer].      Recommend taking Heliocare sun protection supplement daily in sunny weather for additional sun protection. For maximum protection on the sunniest days, you can take up to 2 capsules of regular Heliocare OR take 1 capsule of Heliocare Ultra. For prolonged exposure (such as a full day in the sun), you can repeat your dose of the supplement 4 hours after your first dose. Heliocare can be purchased at [facility] or at [email or web address].      Cryotherapy Aftercare  Wash gently with soap and water everyday.   Apply Vaseline and Band-Aid daily until healed.    If you have any questions or concerns for your doctor, please call our main line at [phone] and press option 4 to reach your doctor's medical assistant. If no one answers, please leave a voicemail as directed and we will return your call as soon as possible. Messages left after 4 pm will be answered the following business day.   You may also send [place] a message via [place]. We typically respond to MyChart messages within 1-2 business days.  For prescription refills, please ask your pharmacy to contact our office. Our fax number is [phone].  If you have an urgent issue when the clinic is closed that cannot wait until the next business day, you can page your doctor at the number below.    Please note that while we do our best to be available for urgent issues outside of office hours, we are not available 24/7.   If you have an urgent issue and are unable to reach [place], you may choose to seek medical care at your doctor's office, retail clinic, urgent care center, or emergency room.  If you have a medical emergency, please immediately call 911 or go to the emergency department.  Pager Numbers  - Dr. Nehemiah Massed: [phone]  - Dr. Laurence Ferrari: [phone]  -  Dr. Nicole Kindred: [phone]  In the event of inclement weather, please call our main line at [phone] for an update on the status of any delays or closures.  Dermatology Medication Tips: Please keep the boxes that topical medications come in in order to help keep track of the instructions about where and how to use these. Pharmacies typically print the medication instructions only on the boxes and not directly on the medication tubes.   If your medication is too expensive, please contact our office at [phone] option 4 or send [place] a message through [place].   We are unable to tell what your co-pay for medications will be in advance as this is different depending on your insurance coverage. However, we may be able to find a substitute medication at lower cost or fill out paperwork to get insurance to cover a needed medication.   If a prior authorization is required to get your medication covered by your insurance company, please allow [place] 1-2 business days to complete this process.  Drug prices often vary depending on where the prescription is filled and some pharmacies may offer cheaper prices.  The website www.goodrx.com contains coupons for medications through different pharmacies. The prices here do not account for what the cost may be with help from insurance (it may be cheaper with your insurance), but the website can give you the price if you did not use any insurance.  - You can print the associated coupon and  take it with your prescription to the pharmacy.  - You may also stop by our office during regular business hours and pick up a GoodRx coupon card.  - If you need your prescription sent electronically to a different pharmacy, notify our office through [facility] or by phone at [phone] option 4.

## 2021-08-25 ENCOUNTER — Encounter: Payer: Self-pay | Admitting: Dermatology

## 2021-12-04 ENCOUNTER — Ambulatory Visit
Admission: RE | Admit: 2021-12-04 | Discharge: 2021-12-04 | Disposition: A | Discharge status: Home / Self Care | Payer: PRIVATE HEALTH INSURANCE | Source: Ambulatory Visit | Attending: Oncology | Admitting: Oncology

## 2021-12-04 ENCOUNTER — Other Ambulatory Visit: Payer: Self-pay

## 2021-12-04 DIAGNOSIS — Z1231 Encounter for screening mammogram for malignant neoplasm of breast: Secondary | ICD-10-CM | POA: Diagnosis not present

## 2021-12-04 DIAGNOSIS — D0511 Intraductal carcinoma in situ of right breast: Secondary | ICD-10-CM | POA: Diagnosis present

## 2021-12-12 ENCOUNTER — Emergency Department: Payer: PRIVATE HEALTH INSURANCE

## 2021-12-12 ENCOUNTER — Emergency Department
Admission: EM | Admit: 2021-12-12 | Discharge: 2021-12-12 | Disposition: A | Discharge status: Home / Self Care | Payer: PRIVATE HEALTH INSURANCE | Attending: Emergency Medicine | Admitting: Emergency Medicine

## 2021-12-12 ENCOUNTER — Encounter: Payer: Self-pay | Admitting: Intensive Care

## 2021-12-12 ENCOUNTER — Other Ambulatory Visit: Payer: Self-pay

## 2021-12-12 DIAGNOSIS — Z8616 Personal history of COVID-19: Secondary | ICD-10-CM | POA: Diagnosis not present

## 2021-12-12 DIAGNOSIS — K449 Diaphragmatic hernia without obstruction or gangrene: Secondary | ICD-10-CM | POA: Diagnosis not present

## 2021-12-12 DIAGNOSIS — R0789 Other chest pain: Secondary | ICD-10-CM | POA: Diagnosis present

## 2021-12-12 DIAGNOSIS — K219 Gastro-esophageal reflux disease without esophagitis: Secondary | ICD-10-CM | POA: Diagnosis not present

## 2021-12-12 LAB — COMPREHENSIVE METABOLIC PANEL
ALT: 15 U/L (ref 0–44)
AST: 14 U/L — ABNORMAL LOW (ref 15–41)
Albumin: 4.4 g/dL (ref 3.5–5.0)
Alkaline Phosphatase: 98 U/L (ref 38–126)
Anion gap: 7 (ref 5–15)
BUN: 12 mg/dL (ref 6–20)
CO2: 26 mmol/L (ref 22–32)
Calcium: 9.7 mg/dL (ref 8.9–10.3)
Chloride: 105 mmol/L (ref 98–111)
Creatinine, Ser: 0.63 mg/dL (ref 0.44–1.00)
GFR, Estimated: >60 mL/min (ref >60)
Glucose, Bld: 136 mg/dL — ABNORMAL HIGH (ref 70–99)
Potassium: 3.6 mmol/L (ref 3.5–5.1)
Sodium: 138 mmol/L (ref 135–145)
Total Bilirubin: 0.5 mg/dL (ref 0.3–1.2)
Total Protein: 7.3 g/dL (ref 6.5–8.1)

## 2021-12-12 LAB — CBC WITH DIFFERENTIAL/PLATELET
Abs Immature Granulocytes: 0.02 10*3/uL (ref 0.00–0.07)
Basophils Absolute: 0 10*3/uL (ref 0.0–0.1)
Basophils Relative: 0 %
Eosinophils Absolute: 0 10*3/uL (ref 0.0–0.5)
Eosinophils Relative: 0 %
HCT: 41.6 % (ref 36.0–46.0)
Hemoglobin: 13.7 g/dL (ref 12.0–15.0)
Immature Granulocytes: 0 %
Lymphocytes Relative: 16 %
Lymphs Abs: 1.3 10*3/uL (ref 0.7–4.0)
MCH: 30.6 pg (ref 26.0–34.0)
MCHC: 32.9 g/dL (ref 30.0–36.0)
MCV: 92.9 fL (ref 80.0–100.0)
Monocytes Absolute: 0.4 10*3/uL (ref 0.1–1.0)
Monocytes Relative: 5 %
Neutro Abs: 6.1 10*3/uL (ref 1.7–7.7)
Neutrophils Relative %: 79 %
Platelets: 266 10*3/uL (ref 150–400)
RBC: 4.48 MIL/uL (ref 3.87–5.11)
RDW: 13.2 % (ref 11.5–15.5)
WBC: 7.8 10*3/uL (ref 4.0–10.5)
nRBC: 0 % (ref 0.0–0.2)

## 2021-12-12 LAB — TROPONIN I (HIGH SENSITIVITY): Troponin I (High Sensitivity): <2 ng/L (ref <18)

## 2021-12-12 LAB — LIPASE, BLOOD: Lipase: 29 U/L (ref 11–51)

## 2021-12-12 MED ORDER — ALUM & MAG HYDROXIDE-SIMETH 200-200-20 MG/5ML PO SUSP
30.0000 mL | Freq: Once | ORAL | Status: AC
  Administered 2021-12-12: 30 mL via ORAL
  Filled 2021-12-12: qty 30

## 2021-12-12 MED ORDER — SUCRALFATE 1 G PO TABS: 1.0000 g | ORAL_TABLET | Freq: Three times a day (TID) | ORAL | 0 refills | Status: DC

## 2021-12-12 MED ORDER — FAMOTIDINE IN NACL 20-0.9 MG/50ML-% IV SOLN
20.0000 mg | Freq: Once | INTRAVENOUS | Status: AC
Start: 2021-12-12 — End: 2021-12-12
  Administered 2021-12-12: 20 mg via INTRAVENOUS
  Filled 2021-12-12: qty 50

## 2021-12-12 MED ORDER — LIDOCAINE VISCOUS HCL 2 % MT SOLN
15.0000 mL | Freq: Once | OROMUCOSAL | Status: AC
  Administered 2021-12-12: 15 mL via ORAL
  Filled 2021-12-12: qty 15

## 2021-12-12 NOTE — ED Triage Notes (Signed)
Patient reports constant pain in chest with acid reflux X2 weeks. Started pepcid last Sunday and reports relief all week and this AM started having central chest pain that has not subsided.

## 2021-12-12 NOTE — ED Provider Notes (Signed)
[facility] Provider Note    Event Date/Time   First MD Initiated Contact with Patient 12/12/21 1540     (approximate)   History   Chest Pain and Gastroesophageal Reflux   HPI  Lacey Garcia is a 61 y.o. female who presents to the ED for evaluation of Chest Pain and Gastroesophageal Reflux   I review PCP visit from 11/11 patient was being treated for recurrent COVID-19.  She also has a history of GERD.  No known cardiac risk Patient also self-reports a small hiatal hernia, but she has not seen GI and has never had an EGD.  Patient presents to the ED, accompanied by her husband, for evaluation of burning chest pain.  She reports this pain is the same pain that she is accustomed to and attributes to GERD.  She recently started famotidine 6 days ago, that resolved her previous subacute pain, but the pain returned this morning and was not resolved when she took her famotidine this morning.  Due to her persistent discomfort, she presents to the ED for evaluation.  She denies any associated symptoms, such as shortness of breath, fever, emesis, cough, syncope or falls.   Physical Exam   Triage Vital Signs: ED Triage Vitals [12/12/21 1543]  Enc Vitals Group     BP      Pulse      Resp      Temp      Temp src      SpO2      Weight      Height      Head Circumference      Peak Flow      Pain Score 7     Pain Loc      Pain Edu?      Excl. in [place]?     Most recent vital signs: Vitals:   12/12/21 1600 12/12/21 1630  BP: (!) 150/71 (!) 146/81  Pulse: 99   Resp: 20 10  Temp:    SpO2: 98%     General: Awake, no distress.  CV:  Good peripheral perfusion. RRR Resp:  Normal effort.  CTAB. Abd:  No distention.  Minimal epigastric discomfort without peritoneal features.  No RUQ discomfort and abdomen is otherwise benign. MSK:  No deformity noted.  Neuro:  No focal deficits appreciated. Other:     ED Results / Procedures / Treatments   Labs (all  labs ordered are listed, but only abnormal results are displayed) Labs Reviewed  COMPREHENSIVE METABOLIC PANEL - Abnormal; Notable for the following components:      Result Value   Glucose, Bld 136 (*)    AST 14 (*)    All other components within normal limits  CBC WITH DIFFERENTIAL/PLATELET  LIPASE, BLOOD  TROPONIN I (HIGH SENSITIVITY)    EKG Sinus rhythm, rate of 93 bpm.  Normal axis and intervals.  Nonspecific ST changes localized to leads III and aVF.  No recent comparison.   RADIOLOGY CXR reviewed by me without evidence of acute cardiopulmonary pathology. Small hiatal hernia  Official radiology report(s): DG Chest 2 View  Result Date: 12/12/2021 CLINICAL DATA:  Chest pain. EXAM: CHEST - 2 VIEW COMPARISON:  Chest x-ray dated 11/21/2019. FINDINGS: Heart size and mediastinal contours are within normal limits. Hiatal hernia, moderate to large in size. Lungs are clear.  No pleural effusion or pneumothorax is seen. Mild degenerative spondylosis of the thoracic spine. No acute-appearing osseous abnormality. IMPRESSION: 1. No active cardiopulmonary disease. No evidence  of pneumonia or pulmonary edema. 2. Hiatal hernia, moderate to large in size. Electronically Signed   By: Franki Cabot M.D.   On: 12/12/2021 16:17    PROCEDURES and INTERVENTIONS:  .1-3 Lead EKG Interpretation Performed by: Vladimir Crofts, MD Authorized by: Vladimir Crofts, MD     Interpretation: normal     ECG rate:  90   ECG rate assessment: normal     Rhythm: sinus rhythm     Ectopy: none     Conduction: normal    Medications  famotidine (PEPCID) IVPB 20 mg premix (0 mg Intravenous Stopped 12/12/21 1709)  alum & mag hydroxide-simeth (MAALOX/MYLANTA) 200-200-20 MG/5ML suspension 30 mL (30 mLs Oral Given 12/12/21 1625)    And  lidocaine (XYLOCAINE) 2 % viscous mouth solution 15 mL (15 mLs Oral Given 12/12/21 1625)     IMPRESSION / MDM / ASSESSMENT AND PLAN / ED COURSE  I reviewed the triage vital signs and the nursing  notes.  61 year old female who is low risk from a cardiac perspective presents to the ED with acute on chronic burning chest pain, likely due to GERD and symptomatic hiatal hernia and ultimately suitable for outpatient management with GI and PCP follow-up.  She looks clinically well.  EKG is nonischemic and troponin is low.  Normal CBC and metabolic panel does not show any significant biliary obstruction.  Less likely that there is colic or gallstones.  Lipase negative.  CXR shows no infiltrates but does redemonstrate hiatal hernia.  Her symptoms resolved after GI cocktail and IV Protonix.  Less likely to represent ACS, PE or other cardiopulmonary pathology.  I considered observation admission for chest pain, but she is fairly low risk and benign cardiac work-up here.  I provided a prescription for Carafate to add to her famotidine at home and urged her to follow-up with her GI doctor to discuss an EGD.  Discussed return precautions for the ED and she is suitable for outpatient management.  Clinical Course as of 12/12/21 1711  Sat Dec 12, 2021  1704 Reassessed.  Patient with resolution of her symptoms after the GI cocktail and Pepcid IV.  We discussed reassuring work-up and her symptoms are more likely attributed to GERD or gastritis in the setting of her hiatal hernia.  We discussed following up with GI and management at home of her symptoms.  We discussed return precautions for the ED in the meantime.  She is a PCP visit in less than 2 weeks. [DS]    Clinical Course User Index [DS] Vladimir Crofts, MD     FINAL CLINICAL IMPRESSION(S) / ED DIAGNOSES   Final diagnoses:  Other chest pain  Hiatal hernia     Rx / DC Orders   ED Discharge Orders          Ordered    sucralfate (CARAFATE) 1 g tablet  3 times daily with meals & bedtime        12/12/21 1706             Note:  This document was prepared using Dragon voice recognition software and may include unintentional dictation errors.    Vladimir Crofts, MD 12/12/21 [phone]

## 2022-02-22 ENCOUNTER — Other Ambulatory Visit: Payer: Self-pay | Admitting: Gastroenterology

## 2022-02-22 DIAGNOSIS — K449 Diaphragmatic hernia without obstruction or gangrene: Secondary | ICD-10-CM

## 2022-02-26 ENCOUNTER — Ambulatory Visit
Admission: RE | Admit: 2022-02-26 | Discharge: 2022-02-26 | Disposition: A | Discharge status: Home / Self Care | Payer: PRIVATE HEALTH INSURANCE | Source: Ambulatory Visit | Attending: Gastroenterology | Admitting: Gastroenterology

## 2022-02-26 DIAGNOSIS — K449 Diaphragmatic hernia without obstruction or gangrene: Secondary | ICD-10-CM | POA: Insufficient documentation

## 2022-05-04 DIAGNOSIS — K449 Diaphragmatic hernia without obstruction or gangrene: Secondary | ICD-10-CM | POA: Insufficient documentation

## 2022-06-07 ENCOUNTER — Ambulatory Visit: Payer: PRIVATE HEALTH INSURANCE | Admitting: Oncology

## 2022-06-07 ENCOUNTER — Other Ambulatory Visit: Payer: PRIVATE HEALTH INSURANCE

## 2022-06-10 ENCOUNTER — Other Ambulatory Visit: Payer: Self-pay

## 2022-06-10 DIAGNOSIS — D0511 Intraductal carcinoma in situ of right breast: Secondary | ICD-10-CM

## 2022-06-11 ENCOUNTER — Encounter: Payer: Self-pay | Admitting: Oncology

## 2022-06-11 ENCOUNTER — Inpatient Hospital Stay: Payer: PRIVATE HEALTH INSURANCE | Attending: Oncology

## 2022-06-11 ENCOUNTER — Inpatient Hospital Stay (HOSPITAL_BASED_OUTPATIENT_CLINIC_OR_DEPARTMENT_OTHER): Payer: PRIVATE HEALTH INSURANCE | Admitting: Oncology

## 2022-06-11 VITALS — BP 129/69 | HR 68 | Temp 97.8°F | Ht 66.0 in | Wt 143.0 lb

## 2022-06-11 DIAGNOSIS — Z87442 Personal history of urinary calculi: Secondary | ICD-10-CM | POA: Insufficient documentation

## 2022-06-11 DIAGNOSIS — Z85820 Personal history of malignant melanoma of skin: Secondary | ICD-10-CM | POA: Insufficient documentation

## 2022-06-11 DIAGNOSIS — M8580 Other specified disorders of bone density and structure, unspecified site: Secondary | ICD-10-CM

## 2022-06-11 DIAGNOSIS — D0511 Intraductal carcinoma in situ of right breast: Secondary | ICD-10-CM

## 2022-06-11 DIAGNOSIS — M549 Dorsalgia, unspecified: Secondary | ICD-10-CM | POA: Diagnosis not present

## 2022-06-11 DIAGNOSIS — Z170 Estrogen receptor positive status [ER+]: Secondary | ICD-10-CM | POA: Diagnosis not present

## 2022-06-11 DIAGNOSIS — Z79811 Long term (current) use of aromatase inhibitors: Secondary | ICD-10-CM | POA: Insufficient documentation

## 2022-06-11 DIAGNOSIS — Z923 Personal history of irradiation: Secondary | ICD-10-CM | POA: Insufficient documentation

## 2022-06-11 DIAGNOSIS — K219 Gastro-esophageal reflux disease without esophagitis: Secondary | ICD-10-CM | POA: Diagnosis not present

## 2022-06-11 DIAGNOSIS — Z79899 Other long term (current) drug therapy: Secondary | ICD-10-CM | POA: Diagnosis not present

## 2022-06-11 LAB — COMPREHENSIVE METABOLIC PANEL
ALT: 13 U/L (ref 0–44)
AST: 12 U/L — ABNORMAL LOW (ref 15–41)
Albumin: 4 g/dL (ref 3.5–5.0)
Alkaline Phosphatase: 82 U/L (ref 38–126)
Anion gap: 7 (ref 5–15)
BUN: 17 mg/dL (ref 6–20)
CO2: 29 mmol/L (ref 22–32)
Calcium: 8.7 mg/dL — ABNORMAL LOW (ref 8.9–10.3)
Chloride: 103 mmol/L (ref 98–111)
Creatinine, Ser: 0.71 mg/dL (ref 0.44–1.00)
GFR, Estimated: >60 mL/min (ref >60)
Glucose, Bld: 95 mg/dL (ref 70–99)
Potassium: 3.9 mmol/L (ref 3.5–5.1)
Sodium: 139 mmol/L (ref 135–145)
Total Bilirubin: 0.6 mg/dL (ref 0.3–1.2)
Total Protein: 6.9 g/dL (ref 6.5–8.1)

## 2022-06-11 LAB — CBC WITH DIFFERENTIAL/PLATELET
Abs Immature Granulocytes: 0.01 10*3/uL (ref 0.00–0.07)
Basophils Absolute: 0 10*3/uL (ref 0.0–0.1)
Basophils Relative: 1 %
Eosinophils Absolute: 0.1 10*3/uL (ref 0.0–0.5)
Eosinophils Relative: 2 %
HCT: 37.9 % (ref 36.0–46.0)
Hemoglobin: 12.3 g/dL (ref 12.0–15.0)
Immature Granulocytes: 0 %
Lymphocytes Relative: 31 %
Lymphs Abs: 1.5 10*3/uL (ref 0.7–4.0)
MCH: 30.4 pg (ref 26.0–34.0)
MCHC: 32.5 g/dL (ref 30.0–36.0)
MCV: 93.8 fL (ref 80.0–100.0)
Monocytes Absolute: 0.4 10*3/uL (ref 0.1–1.0)
Monocytes Relative: 9 %
Neutro Abs: 2.9 10*3/uL (ref 1.7–7.7)
Neutrophils Relative %: 57 %
Platelets: 216 10*3/uL (ref 150–400)
RBC: 4.04 MIL/uL (ref 3.87–5.11)
RDW: 13.1 % (ref 11.5–15.5)
WBC: 4.9 10*3/uL (ref 4.0–10.5)
nRBC: 0 % (ref 0.0–0.2)

## 2022-06-11 MED ORDER — LETROZOLE 2.5 MG PO TABS: 2.5000 mg | ORAL_TABLET | Freq: Every day | ORAL | 6 refills | Status: DC

## 2022-06-11 NOTE — Progress Notes (Signed)
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 Patient had recent bilateral mammogram done on 08/04/2017 which demonstrated calcifications in the right breast. She had additional film on 08/19/2017 which demonstrated calcification in the posterior aspect of the upper outer quadrant of the right breast. Calcifications spanning an area of 8 mm and are associated with an asymmetric density. Patient had a steroid tactic biopsy on 09/01/2017. Pathology reveals high-grade DCIS, with comedonecrosis and calcifications. ER positive > 90%, PR positive>1%.  Patient denies palpating any breast mass or having any nipple discharge. She denies any breast tenderness or skin changes. She has a remote history of 2 episodes of early stage melanoma. First episode was back in 1993 and she received resection. Since then she has dermatology surveillance routinely and found out another early-stage melanoma which was resected again. She does any family history of cancer. She has been on OCP for many years until 3 years ago, she was taken off OCP and her menstrual period stopped 3 years ago. She denies any use of estrogen replacement. 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 Reports chronic back pain. Denies any cough, chest pain, abdominal pain.   # postmenopausal.  # Osteopenia   Cancer Treatment # 09/27/2017  lumpectomy. Inferior and lateral margin were close at less than 1 mm. 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 Consideration for reexcision of the inferior margin was discussed however after discussion the patient preferred not to have any more surgery. #  s/p  adjuvant radiation. #  01/06/2018 started on Letrozole 2.68m daily #  Zometa was discussed for osteoporosis prevention while on AI. She declined.   INTERVAL HISTORY Patient present for follow-up for DCIS.  She has been on Letrozole since 2019. She tolerates well, patient reports no new complaints today She has no new breast concerns as well.  Review of systems- Review of Systems  Constitutional:  Negative for chills, diaphoresis, fever, malaise/fatigue and weight loss.  HENT:  Negative for ear pain, hearing loss, nosebleeds, sore throat and tinnitus.   Eyes:  Negative for double vision, photophobia, pain and redness.  Respiratory:  Negative for cough, hemoptysis, sputum production, shortness of breath and wheezing.   Cardiovascular:  Negative for chest pain, palpitations, orthopnea, claudication and leg swelling.  Gastrointestinal:  Negative for abdominal pain, blood in stool, constipation, heartburn, nausea and vomiting.  Genitourinary:  Negative for dysuria, hematuria and urgency.  Musculoskeletal:  Negative for back pain, joint pain, myalgias and neck pain.  Skin:  Negative for itching and rash.  Neurological:  Negative for dizziness, tingling, tremors, sensory change and headaches.  Endo/Heme/Allergies:  Negative for environmental allergies. Does not bruise/bleed easily.  Psychiatric/Behavioral:  Negative for depression and hallucinations. The patient is not nervous/anxious and does not have insomnia.    Allergies  Allergen Reactions   Sulfa Antibiotics Hives and Rash     Past Medical History:  Diagnosis Date   Basal cell carcinoma 05/22/2008   Right med. post. thigh. Superficial.    Basal cell carcinoma 09/01/2020   L lateral neck    [place]   [place] breast   [place]   Complication of anesthesia    Dysplastic nevus 07/11/2007   Right sup. med. pretibial. Slight to moderate atypia, close to margin.    Dysplastic nevus 07/11/2007   Left  mid med. thigh. Moderate atypia, close to margin.   Dysplastic nevus 02/14/2008   Left distal tricep. Slight to moderate atypia, close to edge.    Dysplastic nevus 02/14/2008   Right medial knee. Slight to moderate atypia, close to edge.    Dysplastic nevus 02/14/2008   Left sacral superior buttock area. Moderate atypia, extends to one edge.    Dysplastic nevus 05/22/2008   Right lateral inframammary. Slight to moderate atypia, margins involved.    Dysplastic nevus 05/22/2008   Left med. tricep, middle. Slight to moderate atypia, margins involved.    Dysplastic nevus 09/17/2008   Left tricep distal. Moderate atypia, close to margin.    Dysplastic nevus 09/09/2009   Left sup. ant. lat. thigh. Moderate to severe atypia, close to margins. Excised 11/12/2009, margins free.    Dysplastic nevus 09/09/2009   Left sup. lat. thigh. Moderate atypia, margins involved.    Dysplastic nevus 09/09/2009   Right mid ant. thigh. Moderate to severe atypia, edge involved. Excised 10/29/2009, margins free.    Dysplastic nevus 02/17/2010   Right med. knee. Mild to moderate atypia, limited margins free.    Dysplastic nevus 02/17/2010   Left. sup. lat. thigh near buttock. Mild to moderate atypia, limited margins free.    Dysplastic nevus 02/17/2010   Left volar forearm. Mild to moderate atypia, limited margins free.    Dysplastic nevus 08/31/2010   Left sup. tricep. Moderate atypia, close to margin.   Dysplastic nevus 08/31/2010   Right sup. lat. tricep. Moderate atypia, limited margins free.    Dysplastic nevus 10/15/2010   Left lat. hip near buttocks. Mild to moderate atypia, limited margins free.    Dysplastic nevus 10/15/2010   Right ant. lat. deltoid. Minimal atypia, edges free.    GERD (gastroesophageal reflux disease)    rare-no meds   History of kidney stones 1991   Melanoma ([place]) 1993   Melanoma x 2 times. - L thigh    Melanoma ([place]) 07/11/2007   Right mid med. thigh. Superficial spreading,  Clark's level II, Breslow's 0.1m. Excised 07/19/2007, Margins free.    Personal history of radiation therapy 2018   RIGHT lumpectomy   PONV (postoperative nausea and vomiting)     Past Surgical History:  Procedure Laterality Date   BREAST BIOPSY Right 09/01/2017   AFFIRM Stereo/intraductal carcinoma in situ   BREAST LUMPECTOMY Right 09/27/2017   intraductal cacinoma in situ with radiation   coloscopy  2014   FRACTURE SURGERY     HAND SURGERY     trigger finger release   leg left rod and pins     LITHOTRIPSY     MELANOMA EXCISION     left leg and right leg   PARTIAL MASTECTOMY WITH NEEDLE LOCALIZATION Right 09/27/2017   Procedure: PARTIAL MASTECTOMY WITH NEEDLE LOCALIZATION;  Surgeon: SLeonie Green MD;  Location: [facility] ORS;  Service: General;  Laterality: Right;   SENTINEL NODE BIOPSY Right 09/27/2017   Procedure: SENTINEL NODE BIOPSY;  Surgeon: SLeonie Green MD;  Location: [facility] ORS;  Service: General;  Laterality: Right;    Social History   Socioeconomic History   Marital status: Married    Spouse name: Not on file   Number of children: Not on file   Years of education: Not on file  Highest education level: Not on file  Occupational History   Not on file  Tobacco Use   Smoking status: Never   Smokeless tobacco: Never   Tobacco comments:    no tobacco use  Vaping Use   Vaping Use: Never used  Substance and Sexual Activity   Alcohol use: No   Drug use: No   Sexual activity: Yes  Other Topics Concern   Not on file  Social History Narrative   Not on file   Social Determinants of Health   Financial Resource Strain: Not on file  Food Insecurity: Not on file  Transportation Needs: Not on file  Physical Activity: Not on file  Stress: Not on file  Social Connections: Not on file  Intimate Partner Violence: Not on file     Family History  Problem Relation Age of Onset   Hypertension Mother    Heart disease Mother    Diabetes Father     Hypertension Father    Heart disease Father    Breast cancer Neg Hx      Current Outpatient Medications:    calcium citrate-vitamin D (CITRACAL+D) 315-200 MG-UNIT tablet, Take 1 tablet by mouth 2 (two) times daily., Disp: , Rfl:    Cholecalciferol (VITAMIN D-1000 MAX ST) 25 MCG (1000 UT) tablet, Take 5,000 tablets by mouth., Disp: , Rfl:    magnesium oxide (MAG-OX) 400 MG tablet, Take 400 mg by mouth daily., Disp: , Rfl:    pantoprazole (PROTONIX) 40 MG tablet, Take 40 mg by mouth daily., Disp: , Rfl:    ascorbic acid (VITAMIN C) 500 MG tablet, Take 1 tablet (500 mg total) by mouth daily., Disp:  , Rfl:    cholecalciferol (VITAMIN D) 25 MCG tablet, Take 1 tablet (1,000 Units total) by mouth daily. (Patient not taking: Reported on 06/11/2022), Disp:  , Rfl:    Coenzyme Q10 100 MG capsule, Take 100 mg by mouth daily., Disp: , Rfl:    Crisaborole (EUCRISA) 2 % OINT, Apply to affected hand qd-bid, Disp: 60 g, Rfl: 2   letrozole (FEMARA) 2.5 MG tablet, Take 1 tablet (2.5 mg total) by mouth daily., Disp: 30 tablet, Rfl: 6   Multiple Vitamin (MULTI-VITAMIN DAILY PO), Take 1 tablet by mouth daily. , Disp: , Rfl:    Omega-3 Fatty Acids (FISH OIL) 1000 MG CAPS, Take by mouth. (Patient not taking: Reported on 06/11/2022), Disp: , Rfl:    sucralfate (CARAFATE) 1 g tablet, Take 1 tablet (1 g total) by mouth 4 (four) times daily -  with meals and at bedtime., Disp: 120 tablet, Rfl: 0   Physical exam:  Vitals:   06/11/22 0921  BP: 129/69  Pulse: 68  Temp: 97.8 F (36.6 C)  TempSrc: Tympanic  Weight: 143 lb (64.9 kg)  Height: _0  (1.676 m)   Physical Exam Constitutional:      General: She is not in acute distress.    Appearance: She is not diaphoretic.  HENT:     Head: Normocephalic and atraumatic.     Nose: Nose normal.     Mouth/Throat:     Pharynx: No oropharyngeal exudate.  Eyes:     General: No scleral icterus.       Left eye: No discharge.     Pupils: Pupils are equal, round, and  reactive to light.  Cardiovascular:     Rate and Rhythm: Normal rate and regular rhythm.     Heart sounds: No murmur heard. Pulmonary:     Effort:  Pulmonary effort is normal. No respiratory distress.     Breath sounds: Normal breath sounds. No wheezing or rales.  Chest:     Chest wall: No tenderness.  Abdominal:     General: Bowel sounds are normal. There is no distension.     Palpations: Abdomen is soft. There is no mass.     Tenderness: There is no abdominal tenderness. There is no guarding or rebound.  Musculoskeletal:        General: Normal range of motion.     Cervical back: Normal range of motion and neck supple.  Lymphadenopathy:     Cervical: No cervical adenopathy.  Skin:    General: Skin is warm and dry.     Findings: No erythema.  Neurological:     Mental Status: She is alert and oriented to person, place, and time.  Psychiatric:        Mood and Affect: Affect normal.        Judgment: Judgment normal.    Breast exam was performed in seated and lying down position. History of right lumpectomy with a well-healed surgical scar. No palpable breast mass bilaterally.  No palpable axillary lymphadenopathy bilaterally.  Pathology 09/27/2017. SPECIMEN SUBMITTED:  A. Breast mass, right  B. Sentinel lymph node, right  C. Breast, right; lateral margins  DIAGNOSIS:  A.  RIGHT BREAST MASS; NEEDLE LOCALIZED LUMPECTOMY:  - DUCTAL CARCINOMA IN SITU, NUCLEAR GRADE 3 WITH COMEDONECROSIS AND MICROCALCIFICATIONS.  - BIOPSY SITE CHANGES, MARKER CLIP PRESENT.  - THE SURGICAL MARGINS ARE CLOSE BUT NEGATIVE (INFERIOR AND LATERAL MARGINS <1 MM).  - SEE SUMMARY BELOW.   B.  RIGHT SENTINEL LYMPH NODE; EXCISION:  - NO TUMOR SEEN IN TWO LYMPH NODES (0/2).   C.  RIGHT BREAST, LATERAL MARGIN;  - NO TUMOR SEEN.  - FIBROCYSTIC CHANGE.  DUCTAL CARCINOMA IN SITU OF THE BREAST:  Procedure: Needle localized lumpectomy  Specimen Laterality:  Right  Size (Extent) of DCIS:  at least 36 mm   Histologic Type: Ductal carcinoma in situ (DCIS)  Nuclear Grade: 3  Necrosis: Comedo necrosis present  Margins:  Close (<1 mm to inferior and lateral) but negative  Regional Lymph nodes: Uninvolved by tumor cells  Number of Lymph Nodes Examined: 2  Number of Sentinel Nodes Examined: 2  Pathologic Stage Classification (pTNM, AJCC 8th Edition): pTis  pNx  TNM Descriptors: Not applicable   BREAST BIOMARKER TESTS (performed on a separate specimen):  Estrogen Receptor (ER) Status: POSITIVE, >90% nuclear staining       Average intensity of staining: Strong  Progesterone Receptor (PgR) Status: POSITIVE, 1% nuclear staining       Average intensity of staining: Strong   Assessment and plan- Patient is a 61 y.o. female who has distant history of melanoma s/p resection presents for follow-up of right breast high grade DCIS. Pathology staging pTis pN0cM0   Cancer Staging  DCIS (ductal carcinoma in situ) Staging form: Breast, AJCC 8th Edition - Clinical: No stage assigned - Unsigned - Pathologic stage from 10/05/2017: Stage 0 (pTis (DCIS), pN0, cM0, G3, ER+, PR+, HER2: Not Assessed) - Signed by Earlie Server, MD on 10/05/2017 Histologic grading system: 3 grade system  1. Ductal carcinoma in situ (DCIS) of right breast   2. Osteopenia, unspecified location    # History of high grade DCIS of right breast Patient is doing very well clinically Physical examination nonremarkable Recommend patient to complete 5 years of letrozole, take till March 2024.  Refills of letrozole were sent  Recommend patient to continue annual screening mammogram.  Will obtain the next mammogram in January 2024. After that, patient prefers to be discharged from our clinic and follow-up with her primary care provider and gynecologist.  She will ask her gynecologist to order screening mammogram annually.   # Osteopenia, continue calcium and vitamin D supplements.   She previously declined bisphosphonate treatments.  Patient is  overdue for bone density.  Patient prefers to defer for now and ask her primary care physician or gynecologist to order DEXA  -No follow-up will be scheduled.  Mammogram in January 2024.  Socrates Cahoon.Tasia Catchings MD 06/11/22

## 2022-07-01 DIAGNOSIS — R7303 Prediabetes: Secondary | ICD-10-CM | POA: Insufficient documentation

## 2022-07-22 ENCOUNTER — Encounter: Payer: Self-pay | Admitting: Oncology

## 2022-07-22 NOTE — Telephone Encounter (Signed)
Please advise 

## 2022-08-23 ENCOUNTER — Ambulatory Visit (INDEPENDENT_AMBULATORY_CARE_PROVIDER_SITE_OTHER): Payer: PRIVATE HEALTH INSURANCE | Admitting: Dermatology

## 2022-08-23 DIAGNOSIS — L820 Inflamed seborrheic keratosis: Secondary | ICD-10-CM | POA: Diagnosis not present

## 2022-08-23 DIAGNOSIS — L578 Other skin changes due to chronic exposure to nonionizing radiation: Secondary | ICD-10-CM

## 2022-08-23 DIAGNOSIS — Z1283 Encounter for screening for malignant neoplasm of skin: Secondary | ICD-10-CM | POA: Diagnosis not present

## 2022-08-23 DIAGNOSIS — L814 Other melanin hyperpigmentation: Secondary | ICD-10-CM

## 2022-08-23 DIAGNOSIS — L720 Epidermal cyst: Secondary | ICD-10-CM | POA: Diagnosis not present

## 2022-08-23 DIAGNOSIS — Z79899 Other long term (current) drug therapy: Secondary | ICD-10-CM

## 2022-08-23 DIAGNOSIS — Z85820 Personal history of malignant melanoma of skin: Secondary | ICD-10-CM

## 2022-08-23 DIAGNOSIS — D229 Melanocytic nevi, unspecified: Secondary | ICD-10-CM

## 2022-08-23 DIAGNOSIS — Z86018 Personal history of other benign neoplasm: Secondary | ICD-10-CM

## 2022-08-23 DIAGNOSIS — L821 Other seborrheic keratosis: Secondary | ICD-10-CM

## 2022-08-23 DIAGNOSIS — Z85828 Personal history of other malignant neoplasm of skin: Secondary | ICD-10-CM

## 2022-08-23 MED ORDER — TRETINOIN 0.025 % EX CREA: TOPICAL_CREAM | Freq: Every day | CUTANEOUS | 4 refills | Status: DC

## 2022-08-23 NOTE — Progress Notes (Signed)
Follow-Up Visit   Subjective  Lacey Garcia is a 61 y.o. female who presents for the following: Total body skin exam (Hx of Melanoma L thigh 1993, R mid med thigh 2008, hx of BCCs, Hx of Dysplastic Nevi) and check spots (Face, few months, irritating, ). The patient presents for Total-Body Skin Exam (TBSE) for skin cancer screening and mole check.  The patient has spots, moles and lesions to be evaluated, some may be new or changing and the patient has concerns that these could be cancer.   The following portions of the chart were reviewed this encounter and updated as appropriate:   Tobacco  Allergies  Meds  Problems  Med Hx  Surg Hx  Fam Hx     Review of Systems:  No other skin or systemic complaints except as noted in HPI or Assessment and Plan.  Objective  Well appearing patient in no apparent distress; mood and affect are within normal limits.  A full examination was performed including scalp, head, eyes, ears, nose, lips, neck, chest, axillae, abdomen, back, buttocks, bilateral upper extremities, bilateral lower extremities, hands, feet, fingers, toes, fingernails, and toenails. All findings within normal limits unless otherwise noted below.  L thigh, R mid med thigh Well healed scars with no evidence of recurrence, no lymphadenopathy.   face White paps face  R forehead x 2, R cheek x 1, bil lower legs x 3, R hand dorsum x 2, R forearm x 1 (9) Stuck on waxy paps with erythema   Assessment & Plan   History of Basal Cell Carcinoma of the Skin - No evidence of recurrence today - Recommend regular full body skin exams - Recommend daily broad spectrum sunscreen SPF 30+ to sun-exposed areas, reapply every 2 hours as needed.  - Call if any new or changing lesions are noted between office visits  - multiple  History of Dysplastic Nevi - No evidence of recurrence today - Recommend regular full body skin exams - Recommend daily broad spectrum sunscreen SPF 30+ to  sun-exposed areas, reapply every 2 hours as needed.  - Call if any new or changing lesions are noted between office visits  - multiple  Lentigines - Scattered tan macules - Due to sun exposure - Benign-appearing, observe - Recommend daily broad spectrum sunscreen SPF 30+ to sun-exposed areas, reapply every 2 hours as needed. - Call for any changes  Seborrheic Keratoses - Stuck-on, waxy, tan-brown papules and/or plaques  - Benign-appearing - Discussed benign etiology and prognosis. - Observe - Call for any changes  Melanocytic Nevi - Tan-brown and/or pink-flesh-colored symmetric macules and papules - Benign appearing on exam today - Observation - Call clinic for new or changing moles - Recommend daily use of broad spectrum spf 30+ sunscreen to sun-exposed areas.   Hemangiomas - Red papules - Discussed benign nature - Observe - Call for any changes  Actinic Damage - Chronic condition, secondary to cumulative UV/sun exposure - diffuse scaly erythematous macules with underlying dyspigmentation - Recommend daily broad spectrum sunscreen SPF 30+ to sun-exposed areas, reapply every 2 hours as needed.  - Staying in the shade or wearing long sleeves, sun glasses (UVA+UVB protection) and wide brim hats (4-inch brim around the entire circumference of the hat) are also recommended for sun protection.  - Call for new or changing lesions.  Skin cancer screening performed today.   History of melanoma L thigh, R mid med thigh L thigh excised 1993, R mid med thigh excised 2008 Clear.  No  lymphadenopathy.  Observe for recurrence. Call clinic for new or changing lesions.  Recommend regular skin exams, daily broad-spectrum spf 30+ sunscreen use, and photoprotection.    Milia Face Chronic and persistent condition with duration or expected duration over one year. Condition is symptomatic / bothersome to patient. Not to goal.  Start Tretinoin 0.025% cr qhs to face for milia 2 Samples of  Aklief to [place] qhs  to spot treat Lot #[id number] exp 11/2023  Topical retinoid medications like tretinoin/Retin-A, adapalene/Differin, tazarotene/Fabior, and Epiduo/Epiduo Forte can cause dryness and irritation when first started. Only apply a pea-sized amount to the entire affected area. Avoid applying it around the eyes, edges of mouth and creases at the nose. If you experience irritation, use a good moisturizer first and/or apply the medicine less often. If you are doing well with the medicine, you can increase how often you use it until you are applying every night. Be careful with sun protection while using this medication as it can make you sensitive to the sun. This medicine should not be used by pregnant women.    tretinoin (RETIN-A) 0.025 % cream - face Apply topically at bedtime. Qhs to face for acne  Inflamed seborrheic keratosis (9) R forehead x 2, R cheek x 1, bil lower legs x 3, R hand dorsum x 2, R forearm x 1 Symptomatic, irritating, patient would like treated. Destruction of lesion - R forehead x 2, R cheek x 1, bil lower legs x 3, R hand dorsum x 2, R forearm x 1 Complexity: simple   Destruction method: cryotherapy   Informed consent: discussed and consent obtained   Timeout:  patient name, date of birth, surgical site, and procedure verified Lesion destroyed using liquid nitrogen: Yes   Region frozen until ice ball extended beyond lesion: Yes   Outcome: patient tolerated procedure well with no complications   Post-procedure details: wound care instructions given    Return in about 1 year (around 08/24/2023) for TBSE, Hx of Melanoma, Hx of BCC, Hx of Dysplastic nevi.  I, Othelia Pulling, RMA, am acting as scribe for Sarina Ser, MD .  Documentation: I have reviewed the above documentation for accuracy and completeness, and I agree with the above.  Sarina Ser, MD

## 2022-08-23 NOTE — Patient Instructions (Addendum)
Start Aklief cream nightly to spot treat the white bumps Start Tretinoin 0.025% cream pea size amount nightly to entire face  Topical retinoid medications like tretinoin/Retin-A, adapalene/Differin, tazarotene/Fabior, and Epiduo/Epiduo Forte can cause dryness and irritation when first started. Only apply a pea-sized amount to the entire affected area. Avoid applying it around the eyes, edges of mouth and creases at the nose. If you experience irritation, use a good moisturizer first and/or apply the medicine less often. If you are doing well with the medicine, you can increase how often you use it until you are applying every night. Be careful with sun protection while using this medication as it can make you sensitive to the sun. This medicine should not be used by pregnant women.       Due to recent changes in healthcare laws, you may see results of your pathology and/or laboratory studies on MyChart before the doctors have had a chance to review them. We understand that in some cases there may be results that are confusing or concerning to you. Please understand that not all results are received at the same time and often the doctors may need to interpret multiple results in order to provide you with the best plan of care or course of treatment. Therefore, we ask that you please give [place] 2 business days to thoroughly review all your results before contacting the office for clarification. Should we see a critical lab result, you will be contacted sooner.   If You Need Anything After Your Visit  If you have any questions or concerns for your doctor, please call our main line at [phone] and press option 4 to reach your doctor's medical assistant. If no one answers, please leave a voicemail as directed and we will return your call as soon as possible. Messages left after 4 pm will be answered the following business day.   You may also send [place] a message via [place]. We typically respond to MyChart  messages within 1-2 business days.  For prescription refills, please ask your pharmacy to contact our office. Our fax number is [phone].  If you have an urgent issue when the clinic is closed that cannot wait until the next business day, you can page your doctor at the number below.    Please note that while we do our best to be available for urgent issues outside of office hours, we are not available 24/7.   If you have an urgent issue and are unable to reach [place], you may choose to seek medical care at your doctor's office, retail clinic, urgent care center, or emergency room.  If you have a medical emergency, please immediately call 911 or go to the emergency department.  Pager Numbers  - Dr. Nehemiah Massed: [phone]  - Dr. Laurence Ferrari: [phone]  - Dr. Nicole Kindred: [phone]  In the event of inclement weather, please call our main line at [phone] for an update on the status of any delays or closures.  Dermatology Medication Tips: Please keep the boxes that topical medications come in in order to help keep track of the instructions about where and how to use these. Pharmacies typically print the medication instructions only on the boxes and not directly on the medication tubes.   If your medication is too expensive, please contact our office at [phone] option 4 or send [place] a message through [place].   We are unable to tell what your co-pay for medications will be in advance as this is different depending on your  insurance coverage. However, we may be able to find a substitute medication at lower cost or fill out paperwork to get insurance to cover a needed medication.   If a prior authorization is required to get your medication covered by your insurance company, please allow [place] 1-2 business days to complete this process.  Drug prices often vary depending on where the prescription is filled and some pharmacies may offer cheaper prices.  The website www.goodrx.com contains  coupons for medications through different pharmacies. The prices here do not account for what the cost may be with help from insurance (it may be cheaper with your insurance), but the website can give you the price if you did not use any insurance.  - You can print the associated coupon and take it with your prescription to the pharmacy.  - You may also stop by our office during regular business hours and pick up a GoodRx coupon card.  - If you need your prescription sent electronically to a different pharmacy, notify our office through [facility] or by phone at [phone] option 4.     Si Usted Necesita Algo Despus de Su Visita  Tambin puede enviarnos un mensaje a travs de Pharmacist, community. Por lo general respondemos a los mensajes de MyChart en el transcurso de 1 a 2 das hbiles.  Para renovar recetas, por favor pida a su farmacia que se ponga en contacto con nuestra oficina. Harland Dingwall de fax es [place] [phone].  Si tiene un asunto urgente cuando la clnica est cerrada y que no puede esperar hasta el siguiente da hbil, puede llamar/localizar a su doctor(a) al nmero que aparece a continuacin.   Por favor, tenga en cuenta que aunque hacemos todo lo posible para estar disponibles para asuntos urgentes fuera del horario de Rafael Hernandez, no estamos disponibles las 24 horas del da, los 7 das de la [place].   Si tiene un problema urgente y no puede comunicarse con nosotros, puede optar por buscar atencin mdica  en el consultorio de su doctor(a), en una clnica privada, en un centro de atencin urgente o en una sala de emergencias.  Si tiene Engineering geologist, por favor llame inmediatamente al 911 o vaya a la sala de emergencias.  Nmeros de bper  - Dr. Nehemiah Massed: [phone]  - Dra. Moye: [phone]  - Dra. Nicole Kindred: [phone]  En caso de inclemencias del [place], por favor llame a Johnsie Kindred principal al [phone] para una actualizacin sobre el [place] de  cualquier retraso o cierre.  Consejos para la medicacin en dermatologa: Por favor, guarde las cajas en las que vienen los medicamentos de uso tpico para ayudarle a seguir las instrucciones sobre dnde y cmo usarlos. Las farmacias generalmente imprimen las instrucciones del medicamento slo en las cajas y no directamente en los tubos del [employer].   Si su medicamento es muy caro, por favor, pngase en contacto con Zigmund Daniel llamando al [phone] y presione la opcin 4 o envenos un mensaje a travs de Pharmacist, community.   No podemos decirle cul ser su copago por los medicamentos por adelantado ya que esto es diferente dependiendo de la cobertura de su seguro. Sin embargo, es posible que podamos encontrar un medicamento sustituto a Electrical engineer un formulario para que el seguro cubra el medicamento que se considera necesario.   Si se requiere una autorizacin previa para que su compaa de seguros [place] su medicamento, por favor permtanos de 1 a 2 das hbiles para completar este proceso.  Los  precios de los medicamentos varan con frecuencia dependiendo del lugar de dnde se surte la receta y alguna farmacias pueden ofrecer precios ms baratos.  El sitio web www.goodrx.com tiene cupones para medicamentos de Airline pilot. Los precios aqu no tienen en cuenta lo que podra costar con la ayuda del seguro (puede ser ms barato con su seguro), pero el sitio web puede darle el precio si no utiliz Research scientist (physical sciences).  - Puede imprimir el cupn correspondiente y llevarlo con su receta a la farmacia.  - Tambin puede pasar por nuestra oficina durante el horario de atencin regular y Charity fundraiser una tarjeta de cupones de GoodRx.  - Si necesita que su receta se enve electrnicamente a una farmacia diferente, informe a nuestra oficina a travs de MyChart de [place] o por telfono llamando al [phone] y presione la opcin 4.

## 2022-08-25 ENCOUNTER — Encounter: Payer: Self-pay | Admitting: Oncology

## 2022-08-26 NOTE — Telephone Encounter (Signed)
Please advise 

## 2022-08-27 NOTE — Telephone Encounter (Signed)
Called pt and no answer. Left VM to call or send mychart message and let us know if she would like to set up mycart visit with Lauren this afternoon.

## 2022-08-27 NOTE — Telephone Encounter (Signed)
Blair - FYI 

## 2022-09-04 ENCOUNTER — Encounter: Payer: Self-pay | Admitting: Dermatology

## 2022-10-10 ENCOUNTER — Encounter: Payer: Self-pay | Admitting: Dermatology

## 2022-12-10 ENCOUNTER — Ambulatory Visit
Admission: RE | Admit: 2022-12-10 | Discharge: 2022-12-10 | Disposition: A | Discharge status: Home / Self Care | Payer: PRIVATE HEALTH INSURANCE | Source: Ambulatory Visit | Attending: Oncology | Admitting: Oncology

## 2022-12-10 DIAGNOSIS — Z1231 Encounter for screening mammogram for malignant neoplasm of breast: Secondary | ICD-10-CM | POA: Insufficient documentation

## 2022-12-10 DIAGNOSIS — D0511 Intraductal carcinoma in situ of right breast: Secondary | ICD-10-CM | POA: Diagnosis present

## 2022-12-27 ENCOUNTER — Other Ambulatory Visit: Payer: Self-pay | Admitting: Oncology

## 2022-12-27 NOTE — Telephone Encounter (Signed)
Patient has been discharged from Clinic as of 06/11/22 Follow-up and Disposition History   06/11/2022 [id number] - Earlie Server, MD  Check-out note: Mammogram bilateral screening end of Jan 2024  Discharge  zy      # 09/27/2017  lumpectomy. Inferior and lateral margin were close at less than 1 mm. 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 Consideration for reexcision of the inferior margin was discussed however after discussion the patient preferred not to have any more surgery. #  s/p adjuvant radiation. #  01/06/2018 started on Letrozole 2.44m daily #  Zometa was discussed for osteoporosis prevention while on AI. She declined.

## 2023-01-07 DIAGNOSIS — K219 Gastro-esophageal reflux disease without esophagitis: Secondary | ICD-10-CM | POA: Insufficient documentation

## 2023-09-08 ENCOUNTER — Encounter: Payer: Self-pay | Admitting: Dermatology

## 2023-09-08 ENCOUNTER — Ambulatory Visit: Payer: PRIVATE HEALTH INSURANCE | Admitting: Dermatology

## 2023-09-08 DIAGNOSIS — L570 Actinic keratosis: Secondary | ICD-10-CM

## 2023-09-08 DIAGNOSIS — D1801 Hemangioma of skin and subcutaneous tissue: Secondary | ICD-10-CM

## 2023-09-08 DIAGNOSIS — L814 Other melanin hyperpigmentation: Secondary | ICD-10-CM

## 2023-09-08 DIAGNOSIS — L820 Inflamed seborrheic keratosis: Secondary | ICD-10-CM | POA: Diagnosis not present

## 2023-09-08 DIAGNOSIS — W908XXA Exposure to other nonionizing radiation, initial encounter: Secondary | ICD-10-CM

## 2023-09-08 DIAGNOSIS — L821 Other seborrheic keratosis: Secondary | ICD-10-CM

## 2023-09-08 DIAGNOSIS — L578 Other skin changes due to chronic exposure to nonionizing radiation: Secondary | ICD-10-CM

## 2023-09-08 DIAGNOSIS — Z85828 Personal history of other malignant neoplasm of skin: Secondary | ICD-10-CM

## 2023-09-08 DIAGNOSIS — Z1283 Encounter for screening for malignant neoplasm of skin: Secondary | ICD-10-CM | POA: Diagnosis not present

## 2023-09-08 DIAGNOSIS — D229 Melanocytic nevi, unspecified: Secondary | ICD-10-CM

## 2023-09-08 DIAGNOSIS — Z808 Family history of malignant neoplasm of other organs or systems: Secondary | ICD-10-CM

## 2023-09-08 DIAGNOSIS — Z85820 Personal history of malignant melanoma of skin: Secondary | ICD-10-CM

## 2023-09-08 DIAGNOSIS — Z7189 Other specified counseling: Secondary | ICD-10-CM

## 2023-09-08 DIAGNOSIS — L905 Scar conditions and fibrosis of skin: Secondary | ICD-10-CM

## 2023-09-08 DIAGNOSIS — Z86018 Personal history of other benign neoplasm: Secondary | ICD-10-CM

## 2023-09-08 DIAGNOSIS — L738 Other specified follicular disorders: Secondary | ICD-10-CM

## 2023-09-08 NOTE — Progress Notes (Signed)
Follow-Up Visit   Subjective  Lacey Garcia is a 62 y.o. female who presents for the following: Skin Cancer Screening and Full Body Skin Exam. Hx of MM x3. Hx of BCCs. Hx of dysplastic nevi.   Spots of concern on face, right arm, right low back, left leg, right neck near collarbone.   The patient presents for Total-Body Skin Exam (TBSE) for skin cancer screening and mole check. The patient has spots, moles and lesions to be evaluated, some may be new or changing and the patient may have concern these could be cancer.    The following portions of the chart were reviewed this encounter and updated as appropriate: medications, allergies, medical history  Review of Systems:  No other skin or systemic complaints except as noted in HPI or Assessment and Plan.  Objective  Well appearing patient in no apparent distress; mood and affect are within normal limits.  A full examination was performed including scalp, head, eyes, ears, nose, lips, neck, chest, axillae, abdomen, back, buttocks, bilateral upper extremities, bilateral lower extremities, hands, feet, fingers, toes, fingernails, and toenails. All findings within normal limits unless otherwise noted below.   Relevant physical exam findings are noted in the Assessment and Plan.  left preauricular x1, L nose x2, R forehead x1 (4) Erythematous thin papules/macules with gritty scale.   L forehead x1,  R preauricular x1, L preauricular x1, R posterior waistline x1, L calf x1, R neck x1 (6) Erythematous keratotic or waxy stuck-on papule or plaque.    Assessment & Plan   FAMILY HISTORY OF SKIN CANCER What type(s): MM Who affected: Mother   HISTORY OF MELANOMA. L thigh, R mid med thigh L thigh excised 1993, R mid med thigh excised 2008 - No evidence of recurrence today - No lymphadenopathy - Recommend regular full body skin exams - Recommend daily broad spectrum sunscreen SPF 30+ to sun-exposed areas, reapply every 2 hours as needed.   - Call if any new or changing lesions are noted between office visits   Discussed and recommended genetic testing. Patient defers for now.  HISTORY OF BASAL CELL CARCINOMA OF THE SKIN - No evidence of recurrence today - Recommend regular full body skin exams - Recommend daily broad spectrum sunscreen SPF 30+ to sun-exposed areas, reapply every 2 hours as needed.  - Call if any new or changing lesions are noted between office visits  HISTORY OF DYSPLASTIC NEVI No evidence of recurrence today Recommend regular full body skin exams Recommend daily broad spectrum sunscreen SPF 30+ to sun-exposed areas, reapply every 2 hours as needed.  Call if any new or changing lesions are noted between office visits   SKIN CANCER SCREENING PERFORMED TODAY.  ACTINIC DAMAGE - Chronic condition, secondary to cumulative UV/sun exposure - diffuse scaly erythematous macules with underlying dyspigmentation - Recommend daily broad spectrum sunscreen SPF 30+ to sun-exposed areas, reapply every 2 hours as needed.  - Staying in the shade or wearing long sleeves, sun glasses (UVA+UVB protection) and wide brim hats (4-inch brim around the entire circumference of the hat) are also recommended for sun protection.  - Call for new or changing lesions.  LENTIGINES, SEBORRHEIC KERATOSES, HEMANGIOMAS - Benign normal skin lesions - Benign-appearing - Call for any changes  MELANOCYTIC NEVI - Tan-brown and/or pink-flesh-colored symmetric macules and papules - Benign appearing on exam today - Observation - Call clinic for new or changing moles - Recommend daily use of broad spectrum spf 30+ sunscreen to sun-exposed areas.  SCAR, secondary to shingles Exam: Dyspigmented smooth macule or patch right glabella. Benign-appearing.  Observation.  Call clinic for new or changing lesions. Recommend daily broad spectrum sunscreen SPF 30+, reapply every 2 hours as needed. Treatment: Recommend Serica moisturizing scar formula  cream every night or [employer] brand or Mederma silicone scar sheet every night for the first year after a scar appears to help with scar remodeling if desired. Scars remodel on their own for a full year and will gradually improve in appearance over time.    Sebaceous Hyperplasia - Small yellow papules with a central dell - Benign-appearing - Observe. Call for changes.   AK (actinic keratosis) (4) left preauricular x1, L nose x2, R forehead x1  Actinic keratoses are precancerous spots that appear secondary to cumulative UV radiation exposure/sun exposure over time. They are chronic with expected duration over 1 year. A portion of actinic keratoses will progress to squamous cell carcinoma of the skin. It is not possible to reliably predict which spots will progress to skin cancer and so treatment is recommended to prevent development of skin cancer.  Recommend daily broad spectrum sunscreen SPF 30+ to sun-exposed areas, reapply every 2 hours as needed.  Recommend staying in the shade or wearing long sleeves, sun glasses (UVA+UVB protection) and wide brim hats (4-inch brim around the entire circumference of the hat). Call for new or changing lesions.  Destruction of lesion - left preauricular x1, L nose x2, R forehead x1 (4) Complexity: simple   Destruction method: cryotherapy   Informed consent: discussed and consent obtained   Timeout:  patient name, date of birth, surgical site, and procedure verified Lesion destroyed using liquid nitrogen: Yes   Region frozen until ice ball extended beyond lesion: Yes   Outcome: patient tolerated procedure well with no complications   Post-procedure details: wound care instructions given   Additional details:  Prior to procedure, discussed risks of blister formation, small wound, skin dyspigmentation, or rare scar following cryotherapy. Recommend Vaseline ointment to treated areas while healing.   Inflamed seborrheic keratosis (6) L forehead x1,  R  preauricular x1, L preauricular x1, R posterior waistline x1, L calf x1, R neck x1  Symptomatic, irritating, patient would like treated.  Destruction of lesion - L forehead x1,  R preauricular x1, L preauricular x1, R posterior waistline x1, L calf x1, R neck x1 (6) Complexity: simple   Destruction method: cryotherapy   Informed consent: discussed and consent obtained   Timeout:  patient name, date of birth, surgical site, and procedure verified Lesion destroyed using liquid nitrogen: Yes   Region frozen until ice ball extended beyond lesion: Yes   Outcome: patient tolerated procedure well with no complications   Post-procedure details: wound care instructions given   Additional details:  Prior to procedure, discussed risks of blister formation, small wound, skin dyspigmentation, or rare scar following cryotherapy. Recommend Vaseline ointment to treated areas while healing.    Return in about 1 year (around 09/07/2024) for TBSE.  I, Lawson Radar, CMA, am acting as scribe for Armida Sans, MD.   Documentation: I have reviewed the above documentation for accuracy and completeness, and I agree with the above.  Armida Sans, MD

## 2023-09-08 NOTE — Patient Instructions (Addendum)

## 2023-11-07 ENCOUNTER — Other Ambulatory Visit: Payer: Self-pay | Admitting: Obstetrics and Gynecology

## 2023-11-07 DIAGNOSIS — Z1231 Encounter for screening mammogram for malignant neoplasm of breast: Secondary | ICD-10-CM

## 2024-01-13 ENCOUNTER — Ambulatory Visit
Admission: RE | Admit: 2024-01-13 | Discharge: 2024-01-13 | Disposition: A | Discharge status: Home / Self Care | Payer: PRIVATE HEALTH INSURANCE | Source: Ambulatory Visit | Attending: Obstetrics and Gynecology | Admitting: Obstetrics and Gynecology

## 2024-01-13 DIAGNOSIS — Z1231 Encounter for screening mammogram for malignant neoplasm of breast: Secondary | ICD-10-CM | POA: Insufficient documentation

## 2024-01-19 ENCOUNTER — Other Ambulatory Visit: Payer: Self-pay | Admitting: Obstetrics and Gynecology

## 2024-01-19 DIAGNOSIS — R928 Other abnormal and inconclusive findings on diagnostic imaging of breast: Secondary | ICD-10-CM

## 2024-01-20 ENCOUNTER — Ambulatory Visit
Admission: RE | Admit: 2024-01-20 | Discharge: 2024-01-20 | Disposition: A | Discharge status: Home / Self Care | Payer: PRIVATE HEALTH INSURANCE | Source: Ambulatory Visit | Attending: Obstetrics and Gynecology | Admitting: Obstetrics and Gynecology

## 2024-01-20 DIAGNOSIS — R928 Other abnormal and inconclusive findings on diagnostic imaging of breast: Secondary | ICD-10-CM | POA: Insufficient documentation

## 2024-03-29 ENCOUNTER — Encounter: Payer: Self-pay | Admitting: Dermatology

## 2024-03-29 ENCOUNTER — Ambulatory Visit (INDEPENDENT_AMBULATORY_CARE_PROVIDER_SITE_OTHER): Payer: PRIVATE HEALTH INSURANCE | Admitting: Dermatology

## 2024-03-29 DIAGNOSIS — Z85820 Personal history of malignant melanoma of skin: Secondary | ICD-10-CM | POA: Diagnosis not present

## 2024-03-29 DIAGNOSIS — L820 Inflamed seborrheic keratosis: Secondary | ICD-10-CM

## 2024-03-29 DIAGNOSIS — Z86018 Personal history of other benign neoplasm: Secondary | ICD-10-CM

## 2024-03-29 DIAGNOSIS — Z85828 Personal history of other malignant neoplasm of skin: Secondary | ICD-10-CM | POA: Diagnosis not present

## 2024-03-29 DIAGNOSIS — L259 Unspecified contact dermatitis, unspecified cause: Secondary | ICD-10-CM

## 2024-03-29 DIAGNOSIS — K449 Diaphragmatic hernia without obstruction or gangrene: Secondary | ICD-10-CM | POA: Insufficient documentation

## 2024-03-29 DIAGNOSIS — Z872 Personal history of diseases of the skin and subcutaneous tissue: Secondary | ICD-10-CM

## 2024-03-29 DIAGNOSIS — L255 Unspecified contact dermatitis due to plants, except food: Secondary | ICD-10-CM

## 2024-03-29 MED ORDER — CLOBETASOL PROPIONATE 0.05 % EX CREA: TOPICAL_CREAM | CUTANEOUS | 0 refills | Status: AC

## 2024-03-29 NOTE — Progress Notes (Signed)
   Follow-Up Visit   Subjective  Lacey Garcia is a 63 y.o. female who presents for the following: here today concerning a scaly spots at right neck area, noticed several months ago, also would like to discuss treatment for possible poison ivy at right arm area, has been working out in yard and noticed itchy spots 3 days ago.    Patient has hx of aks, melanoma, hx of multiple dysplastic nevi and bcc.   Right upper back and right neck base isks  The patient has spots, moles and lesions to be evaluated, some may be new or changing and the patient may have concern these could be cancer.   The following portions of the chart were reviewed this encounter and updated as appropriate: medications, allergies, medical history  Review of Systems:  No other skin or systemic complaints except as noted in HPI or Assessment and Plan.  Objective  Well appearing patient in no apparent distress; mood and affect are within normal limits.   A focused examination was performed of the following areas: right neck, right arm   Relevant exam findings are noted in the Assessment and Plan.  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 Currently flared.   Treatment Plan: Start Clobetasol cream 0.05 use twice daily until clear Avoid applying to face, groin, and axilla. Use as directed. Long-term use can cause thinning of the skin.  Topical steroids (such as triamcinolone, fluocinolone, fluocinonide, mometasone, clobetasol, halobetasol, betamethasone, hydrocortisone) can cause thinning and lightening of the skin if they are used for too long in the same area. Your physician has selected the right strength medicine for your problem and area affected on the body. Please  use your medication only as directed by your physician to prevent side effects.    CONTACT DERMATITIS DUE TO PLANTS, EXCEPT FOOD, UNSPECIFIED CONTACT DERMATITIS TYPE   Related Medications clobetasol cream (TEMOVATE) 0.05 % Apply to areas of rash at right arm twice daily until clear Avoid applying to face, groin, and axilla. Use as directed. INFLAMED SEBORRHEIC KERATOSIS (2) right upper back x 1, right neck base x 1 (2) Symptomatic, irritating, patient would like treated. Destruction of lesion - right upper back x 1, right neck base x 1 (2) Complexity: simple   Destruction method: cryotherapy   Informed consent: discussed and consent obtained   Timeout:  patient name, date of birth, surgical site, and procedure verified Lesion destroyed using liquid nitrogen: Yes   Region frozen until ice ball extended beyond lesion: Yes   Cryo cycles: 1 or 2. Outcome: patient tolerated procedure well with no complications   Post-procedure details: wound care instructions given    Return for keep follow up as scheduled .  I, Randee Busing, CMA, am acting as scribe for Harris Liming, MD.   Documentation: I have reviewed the above documentation for accuracy and completeness, and I agree with the above.  Harris Liming, MD

## 2024-03-29 NOTE — Patient Instructions (Addendum)
 For rash   Start clobetasol cream - twice daily to affected area until clear. Avoid applying to face, groin, and axilla. Use as directed. Long-term use can cause thinning of the skin.  Topical steroids (such as triamcinolone, fluocinolone, fluocinonide, mometasone, clobetasol, halobetasol, betamethasone, hydrocortisone) can cause thinning and lightening of the skin if they are used for too long in the same area. Your physician has selected the right strength medicine for your problem and area affected on the body. Please use your medication only as directed by your physician to prevent side effects.      Seborrheic Keratosis  What causes seborrheic keratoses? Seborrheic keratoses are harmless, common skin growths that first appear during adult life.  As time goes by, more growths appear.  Some people may develop a large number of them.  Seborrheic keratoses appear on both covered and uncovered body parts.  They are not caused by sunlight.  The tendency to develop seborrheic keratoses can be inherited.  They vary in color from skin-colored to gray, brown, or even black.  They can be either smooth or have a rough, warty surface.   Seborrheic keratoses are superficial and look as if they were stuck on the skin.  Under the microscope this type of keratosis looks like layers upon layers of skin.  That is why at times the top layer may seem to fall off, but the rest of the growth remains and re-grows.    Treatment Seborrheic keratoses do not need to be treated, but can easily be removed in the office.  Seborrheic keratoses often cause symptoms when they rub on clothing or jewelry.  Lesions can be in the way of shaving.  If they become inflamed, they can cause itching, soreness, or burning.  Removal of a seborrheic keratosis can be accomplished by freezing, burning, or surgery. If any spot bleeds, scabs, or grows rapidly, please return to have it checked, as these can be an indication of a skin  cancer.  Cryotherapy Aftercare  Wash gently with soap and water everyday.   Apply Vaseline and Band-Aid daily until healed.     Due to recent changes in healthcare laws, you may see results of your pathology and/or laboratory studies on MyChart before the doctors have had a chance to review them. We understand that in some cases there may be results that are confusing or concerning to you. Please understand that not all results are received at the same time and often the doctors may need to interpret multiple results in order to provide you with the best plan of care or course of treatment. Therefore, we ask that you please give us  2 business days to thoroughly review all your results before contacting the office for clarification. Should we see a critical lab result, you will be contacted sooner.   If You Need Anything After Your Visit  If you have any questions or concerns for your doctor, please call our main line at [phone] and press option 4 to reach your doctor's medical assistant. If no one answers, please leave a voicemail as directed and we will return your call as soon as possible. Messages left after 4 pm will be answered the following business day.   You may also send us  a message via MyChart. We typically respond to MyChart messages within 1-2 business days.  For prescription refills, please ask your pharmacy to contact our office. Our fax number is [phone].  If you have an urgent issue when the clinic is closed  that cannot wait until the next business day, you can page your doctor at the number below.    Please note that while we do our best to be available for urgent issues outside of office hours, we are not available 24/7.   If you have an urgent issue and are unable to reach us , you may choose to seek medical care at your doctor's office, retail clinic, urgent care center, or emergency room.  If you have a medical emergency, please immediately call 911 or go to  the emergency department.  Pager Numbers  - Dr. Bary Likes: [phone]  - Dr. Annette Barters: [phone]  - Dr. Felipe Horton: [phone]   In the event of inclement weather, please call our main line at [phone] for an update on the status of any delays or closures.  Dermatology Medication Tips: Please keep the boxes that topical medications come in in order to help keep track of the instructions about where and how to use these. Pharmacies typically print the medication instructions only on the boxes and not directly on the medication tubes.   If your medication is too expensive, please contact our office at [phone] option 4 or send us  a message through MyChart.   We are unable to tell what your co-pay for medications will be in advance as this is different depending on your insurance coverage. However, we may be able to find a substitute medication at lower cost or fill out paperwork to get insurance to cover a needed medication.   If a prior authorization is required to get your medication covered by your insurance company, please allow us  1-2 business days to complete this process.  Drug prices often vary depending on where the prescription is filled and some pharmacies may offer cheaper prices.  The website www.goodrx.com contains coupons for medications through different pharmacies. The prices here do not account for what the cost may be with help from insurance (it may be cheaper with your insurance), but the website can give you the price if you did not use any insurance.  - You can print the associated coupon and take it with your prescription to the pharmacy.  - You may also stop by our office during regular business hours and pick up a GoodRx coupon card.  - If you need your prescription sent electronically to a different pharmacy, notify our office through [facility] or by phone at [phone] option 4.     Si Usted Necesita Algo Despus de Su Visita  Tambin  puede enviarnos un mensaje a travs de Clinical cytogeneticist. Por lo general respondemos a los mensajes de MyChart en el transcurso de 1 a 2 das hbiles.  Para renovar recetas, por favor pida a su farmacia que se ponga en contacto con nuestra oficina. Franz Jacks de fax es [place] [phone].  Si tiene un asunto urgente cuando la clnica est cerrada y que no puede esperar hasta el siguiente da hbil, puede llamar/localizar a su doctor(a) al nmero que aparece a continuacin.   Por favor, tenga en cuenta que aunque hacemos todo lo posible para estar disponibles para asuntos urgentes fuera del horario de [place], no estamos disponibles las 24 horas del da, los 7 [address]  [address] de la [place].   Si tiene un problema urgente y no puede comunicarse con nosotros, puede optar por buscar atencin mdica  en el consultorio de su doctor(a), en una clnica privada, en un centro de atencin urgente o en una sala de emergencias.  Si tiene [employer],  por favor llame inmediatamente al 911 o vaya a la sala de emergencias.  Nmeros de bper  - Dr. Bary Likes: [phone]  - Dra. Annette Barters: [phone]  - Dr. Felipe Horton: [phone]   En caso de inclemencias del tiempo, por favor llame a Lajuan Pila principal al [phone] para una actualizacin sobre el [place] de cualquier retraso o cierre.  Consejos para la medicacin en dermatologa: Por favor, guarde las cajas en las que vienen los medicamentos de uso tpico para ayudarle a seguir las instrucciones sobre dnde y cmo usarlos. Las farmacias generalmente imprimen las instrucciones del medicamento slo en las cajas y no directamente en los tubos del [place].   Si su medicamento es muy caro, por favor, pngase en contacto con Bettyjane Brunet llamando al [phone] y presione la opcin 4 o envenos un mensaje a travs de Clinical cytogeneticist.   No podemos decirle cul ser su copago por los medicamentos por adelantado ya que esto es diferente dependiendo de la cobertura de su  seguro. Sin embargo, es posible que podamos encontrar un medicamento sustituto a Audiological scientist un formulario para que el seguro cubra el medicamento que se considera necesario.   Si se requiere una autorizacin previa para que su compaa de seguros [place] su medicamento, por favor permtanos de 1 a 2 das hbiles para completar este proceso.  Los precios de los medicamentos varan con frecuencia dependiendo del Environmental consultant de dnde se surte la receta y alguna farmacias pueden ofrecer precios ms baratos.  El sitio web www.goodrx.com tiene cupones para medicamentos de Health and safety inspector. Los precios aqu no tienen en cuenta lo que podra costar con la ayuda del seguro (puede ser ms barato con su seguro), pero el sitio web puede darle el precio si no utiliz Tourist information centre manager.  - Puede imprimir el cupn correspondiente y llevarlo con su receta a la farmacia.  - Tambin puede pasar por nuestra oficina durante el horario de atencin regular y Education officer, museum una tarjeta de cupones de GoodRx.  - Si necesita que su receta se enve electrnicamente a una farmacia diferente, informe a nuestra oficina a travs de MyChart de [place] o por telfono llamando al [phone] y presione la opcin 4.

## 2024-09-10 ENCOUNTER — Ambulatory Visit: Payer: PRIVATE HEALTH INSURANCE | Admitting: Dermatology

## 2024-09-10 DIAGNOSIS — L578 Other skin changes due to chronic exposure to nonionizing radiation: Secondary | ICD-10-CM | POA: Diagnosis not present

## 2024-09-10 DIAGNOSIS — L821 Other seborrheic keratosis: Secondary | ICD-10-CM | POA: Diagnosis not present

## 2024-09-10 DIAGNOSIS — L304 Erythema intertrigo: Secondary | ICD-10-CM

## 2024-09-10 DIAGNOSIS — D229 Melanocytic nevi, unspecified: Secondary | ICD-10-CM

## 2024-09-10 DIAGNOSIS — L814 Other melanin hyperpigmentation: Secondary | ICD-10-CM | POA: Diagnosis not present

## 2024-09-10 DIAGNOSIS — W908XXA Exposure to other nonionizing radiation, initial encounter: Secondary | ICD-10-CM | POA: Diagnosis not present

## 2024-09-10 DIAGNOSIS — L988 Other specified disorders of the skin and subcutaneous tissue: Secondary | ICD-10-CM

## 2024-09-10 DIAGNOSIS — Z1283 Encounter for screening for malignant neoplasm of skin: Secondary | ICD-10-CM

## 2024-09-10 DIAGNOSIS — L820 Inflamed seborrheic keratosis: Secondary | ICD-10-CM

## 2024-09-10 DIAGNOSIS — Z86018 Personal history of other benign neoplasm: Secondary | ICD-10-CM

## 2024-09-10 DIAGNOSIS — L570 Actinic keratosis: Secondary | ICD-10-CM | POA: Diagnosis not present

## 2024-09-10 DIAGNOSIS — Z85820 Personal history of malignant melanoma of skin: Secondary | ICD-10-CM

## 2024-09-10 DIAGNOSIS — Z79899 Other long term (current) drug therapy: Secondary | ICD-10-CM

## 2024-09-10 DIAGNOSIS — H938X1 Other specified disorders of right ear: Secondary | ICD-10-CM

## 2024-09-10 DIAGNOSIS — Z85828 Personal history of other malignant neoplasm of skin: Secondary | ICD-10-CM

## 2024-09-10 DIAGNOSIS — L738 Other specified follicular disorders: Secondary | ICD-10-CM

## 2024-09-10 DIAGNOSIS — Z7189 Other specified counseling: Secondary | ICD-10-CM

## 2024-09-10 DIAGNOSIS — Z808 Family history of malignant neoplasm of other organs or systems: Secondary | ICD-10-CM

## 2024-09-10 DIAGNOSIS — D1801 Hemangioma of skin and subcutaneous tissue: Secondary | ICD-10-CM

## 2024-09-10 MED ORDER — TRETINOIN 0.05 % EX CREA
TOPICAL_CREAM | Freq: Every day | CUTANEOUS | 11 refills | Status: AC
Start: 2024-09-10 — End: 2025-09-10

## 2024-09-10 NOTE — Patient Instructions (Addendum)
 SEBORRHEIC KERATOSIS - Stuck-on, waxy, tan-brown papules and/or plaques  - Benign-appearing - Discussed benign etiology and prognosis. - Observe - Call for any changes - Apply diclofenac (voltaren) gel twice a day to spots.   Recommend Serica moisturizing scar formula cream every night or [facility] brand or Mederma silicone scar sheet every night for the first year after a scar appears to help with scar remodeling if desired. Scars remodel on their own for a full year and will gradually improve in appearance over time.   Due to recent changes in healthcare laws, you may see results of your pathology and/or laboratory studies on MyChart before the doctors have had a chance to review them. We understand that in some cases there may be results that are confusing or concerning to you. Please understand that not all results are received at the same time and often the doctors may need to interpret multiple results in order to provide you with the best plan of care or course of treatment. Therefore, we ask that you please give us  2 business days to thoroughly review all your results before contacting the office for clarification. Should we see a critical lab result, you will be contacted sooner.   If You Need Anything After Your Visit  If you have any questions or concerns for your doctor, please call our main line at [phone] and press option 4 to reach your doctor's medical assistant. If no one answers, please leave a voicemail as directed and we will return your call as soon as possible. Messages left after 4 pm will be answered the following business day.   You may also send us  a message via MyChart. We typically respond to MyChart messages within 1-2 business days.  For prescription refills, please ask your pharmacy to contact our office. Our fax number is [phone].  If you have an urgent issue when the clinic is closed that cannot wait until the next business day, you can page your  doctor at the number below.    Please note that while we do our best to be available for urgent issues outside of office hours, we are not available 24/7.   If you have an urgent issue and are unable to reach us , you may choose to seek medical care at your doctor's office, retail clinic, urgent care center, or emergency room.  If you have a medical emergency, please immediately call 911 or go to the emergency department.  Pager Numbers  - Dr. Hester: [phone]  - Dr. Jackquline: [phone]  - Dr. Claudene: [phone]   - Dr. Raymund: [phone]  In the event of inclement weather, please call our main line at [phone] for an update on the status of any delays or closures.  Dermatology Medication Tips: Please keep the boxes that topical medications come in in order to help keep track of the instructions about where and how to use these. Pharmacies typically print the medication instructions only on the boxes and not directly on the medication tubes.   If your medication is too expensive, please contact our office at [phone] option 4 or send us  a message through MyChart.   We are unable to tell what your co-pay for medications will be in advance as this is different depending on your insurance coverage. However, we may be able to find a substitute medication at lower cost or fill out paperwork to get insurance to cover a needed medication.   If a prior authorization is required to get your medication  covered by your insurance company, please allow us  1-2 business days to complete this process.  Drug prices often vary depending on where the prescription is filled and some pharmacies may offer cheaper prices.  The website www.goodrx.com contains coupons for medications through different pharmacies. The prices here do not account for what the cost may be with help from insurance (it may be cheaper with your insurance), but the website can give you the price if you did not use any  insurance.  - You can print the associated coupon and take it with your prescription to the pharmacy.  - You may also stop by our office during regular business hours and pick up a GoodRx coupon card.  - If you need your prescription sent electronically to a different pharmacy, notify our office through [facility] or by phone at [phone] option 4.     Si Usted Necesita Algo Despus de Su Visita  Tambin puede enviarnos un mensaje a travs de Clinical Cytogeneticist. Por lo general respondemos a los mensajes de MyChart en el transcurso de 1 a 2 das hbiles.  Para renovar recetas, por favor pida a su farmacia que se ponga en contacto con nuestra oficina. Randi lakes de fax es [place] [phone].  Si tiene un asunto urgente cuando la clnica est cerrada y que no puede esperar hasta el siguiente da hbil, puede llamar/localizar a su doctor(a) al nmero que aparece a continuacin.   Por favor, tenga en cuenta que aunque hacemos todo lo posible para estar disponibles para asuntos urgentes fuera del horario de [place], no estamos disponibles las 24 horas del da, los 7 [address]  [address] de la [place].   Si tiene un problema urgente y no puede comunicarse con nosotros, puede optar por buscar atencin mdica  en el consultorio de su doctor(a), en una clnica privada, en un centro de atencin urgente o en una sala de emergencias.  Si tiene engineer, drilling, por favor llame inmediatamente al 911 o vaya a la sala de emergencias.  Nmeros de bper  - Dr. Hester: [phone]  - Dra. Jackquline: [phone]  - Dr. Claudene: [phone]  - Dra. Kitts: [phone]  En caso de inclemencias del [place], por favor llame a nuestra lnea principal al [phone] para una actualizacin sobre el [place] de cualquier retraso o cierre.  Consejos para la medicacin en dermatologa: Por favor, guarde las cajas en las que vienen los medicamentos de uso tpico para ayudarle a seguir las instrucciones sobre dnde y cmo  usarlos. Las farmacias generalmente imprimen las instrucciones del medicamento slo en las cajas y no directamente en los tubos del [place].   Si su medicamento es muy caro, por favor, pngase en contacto con landry rieger llamando al [phone] y presione la opcin 4 o envenos un mensaje a travs de Clinical Cytogeneticist.   No podemos decirle cul ser su copago por los medicamentos por adelantado ya que esto es diferente dependiendo de la cobertura de su seguro. Sin embargo, es posible que podamos encontrar un medicamento sustituto a audiological scientist un formulario para que el seguro cubra el medicamento que se considera necesario.   Si se requiere una autorizacin previa para que su compaa de seguros cubra su medicamento, por favor permtanos de 1 a 2 das hbiles para completar este proceso.  Los precios de los medicamentos varan con frecuencia dependiendo del environmental consultant de dnde se surte la receta y alguna farmacias pueden ofrecer precios ms baratos.  El sitio web www.goodrx.com tiene cupones para medicamentos de  diferentes farmacias. Los precios aqu no tienen en cuenta lo que podra costar con la ayuda del seguro (puede ser ms barato con su seguro), pero el sitio web puede darle el precio si no utiliz tourist information centre manager.  - Puede imprimir el cupn correspondiente y llevarlo con su receta a la farmacia.  - Tambin puede pasar por nuestra oficina durante el horario de atencin regular y education officer, museum una tarjeta de cupones de GoodRx.  - Si necesita que su receta se enve electrnicamente a una farmacia diferente, informe a nuestra oficina a travs de MyChart de [place] o por telfono llamando al [phone] y presione la opcin 4.

## 2024-09-10 NOTE — Progress Notes (Unsigned)
 Follow-Up Visit   Subjective  Lacey Garcia is a 63 y.o. female who presents for the following: Skin Cancer Screening and Full Body Skin Exam  The patient presents for Total-Body Skin Exam (TBSE) for skin cancer screening and mole check. The patient has spots, moles and lesions to be evaluated, some may be new or changing and the patient may have concern these could be cancer.  The following portions of the chart were reviewed this encounter and updated as appropriate: medications, allergies, medical history  Review of Systems:  No other skin or systemic complaints except as noted in HPI or Assessment and Plan.  Objective  Well appearing patient in no apparent distress; mood and affect are within normal limits.  A full examination was performed including scalp, head, eyes, ears, nose, lips, neck, chest, axillae, abdomen, back, buttocks, bilateral upper extremities, bilateral lower extremities, hands, feet, fingers, toes, fingernails, and toenails. All findings within normal limits unless otherwise noted below.   Relevant physical exam findings are noted in the Assessment and Plan.  R med canthus lower eyelid margin x 1, R nasal root/medial brow x 1, L forehead x 1, L cheek x 2, chest x 2, R clavicle x 1 (8) Erythematous thin papules/macules with gritty scale.  R lat neck below the ear x 3, R prox mandible x 2, R preauricular x 1, L sup forehead x 1, L forehead x 1, R ant neck x 1, chest x 1, R clavicle x 1, L forearm x 1, arms x 2, R knee x 1, R thigh x 1 (16) Erythematous stuck-on, waxy papule or plaque  Assessment & Plan   SKIN CANCER SCREENING PERFORMED TODAY.  ACTINIC DAMAGE - Chronic condition, secondary to cumulative UV/sun exposure - diffuse scaly erythematous macules with underlying dyspigmentation - Recommend daily broad spectrum sunscreen SPF 30+ to sun-exposed areas, reapply every 2 hours as needed.  - Staying in the shade or wearing long sleeves, sun glasses (UVA+UVB  protection) and wide brim hats (4-inch brim around the entire circumference of the hat) are also recommended for sun protection.  - Call for new or changing lesions.  LENTIGINES, SEBORRHEIC KERATOSES, HEMANGIOMAS - Benign normal skin lesions - Benign-appearing - Call for any changes  MELANOCYTIC NEVI - Tan-brown and/or pink-flesh-colored symmetric macules and papules - Benign appearing on exam today - Observation - Call clinic for new or changing moles - Recommend daily use of broad spectrum spf 30+ sunscreen to sun-exposed areas.   HISTORY OF DYSPLASTIC NEVUS No evidence of recurrence today Recommend regular full body skin exams Recommend daily broad spectrum sunscreen SPF 30+ to sun-exposed areas, reapply every 2 hours as needed.  Call if any new or changing lesions are noted between office visits  HISTORY OF BASAL CELL CARCINOMA OF THE SKIN - No evidence of recurrence today - Recommend regular full body skin exams - Recommend daily broad spectrum sunscreen SPF 30+ to sun-exposed areas, reapply every 2 hours as needed.  - Call if any new or changing lesions are noted between office visits  HISTORY OF MELANOMA - L thigh excised 1993, R mid med thigh excised 2008 - No evidence of recurrence today - No lymphadenopathy - Recommend regular full body skin exams - Recommend daily broad spectrum sunscreen SPF 30+ to sun-exposed areas, reapply every 2 hours as needed.  - Call if any new or changing lesions are noted between office visits  FAMILY HISTORY OF SKIN CANCER What type(s): MM Who affected: Mother  AK (ACTINIC KERATOSIS) (  8) R med canthus lower eyelid margin x 1, R nasal root/medial brow x 1, L forehead x 1, L cheek x 2, chest x 2, R clavicle x 1 (8) Actinic keratoses are precancerous spots that appear secondary to cumulative UV radiation exposure/sun exposure over time. They are chronic with expected duration over 1 year. A portion of actinic keratoses will progress to  squamous cell carcinoma of the skin. It is not possible to reliably predict which spots will progress to skin cancer and so treatment is recommended to prevent development of skin cancer.  Recommend daily broad spectrum sunscreen SPF 30+ to sun-exposed areas, reapply every 2 hours as needed.  Recommend staying in the shade or wearing long sleeves, sun glasses (UVA+UVB protection) and wide brim hats (4-inch brim around the entire circumference of the hat). Call for new or changing lesions. Destruction of lesion - R med canthus lower eyelid margin x 1, R nasal root/medial brow x 1, L forehead x 1, L cheek x 2, chest x 2, R clavicle x 1 (8) Complexity: simple   Destruction method: cryotherapy   Informed consent: discussed and consent obtained   Timeout:  patient name, date of birth, surgical site, and procedure verified Lesion destroyed using liquid nitrogen: Yes   Region frozen until ice ball extended beyond lesion: Yes   Outcome: patient tolerated procedure well with no complications   Post-procedure details: wound care instructions given    INFLAMED SEBORRHEIC KERATOSIS (16) R lat neck below the ear x 3, R prox mandible x 2, R preauricular x 1, L sup forehead x 1, L forehead x 1, R ant neck x 1, chest x 1, R clavicle x 1, L forearm x 1, arms x 2, R knee x 1, R thigh x 1 (16) Symptomatic, irritating, patient would like treated. Destruction of lesion - R lat neck below the ear x 3, R prox mandible x 2, R preauricular x 1, L sup forehead x 1, L forehead x 1, R ant neck x 1, chest x 1, R clavicle x 1, L forearm x 1, arms x 2, R knee x 1, R thigh x 1 (16) Complexity: simple   Destruction method: cryotherapy   Informed consent: discussed and consent obtained   Timeout:  patient name, date of birth, surgical site, and procedure verified Lesion destroyed using liquid nitrogen: Yes   Region frozen until ice ball extended beyond lesion: Yes   Outcome: patient tolerated procedure well with no  complications   Post-procedure details: wound care instructions given     Sebaceous Hyperplasia - face - Small yellow papules with a central dell - Benign-appearing - Observe. Call for changes.  Scar vs EPIDERMAL INCLUSION CYST at ear piercing site. Exam: Subcutaneous nodule at R earlobe  Benign-appearing. Exam most consistent with an epidermal inclusion cyst. Discussed that a cyst is a benign growth that can grow over time and sometimes get irritated or inflamed. Recommend observation if it is not bothersome. Discussed option of surgical excision to remove it if it is growing, symptomatic, or other changes noted. Please call for new or changing lesions so they can be evaluated.  FACIAL ELASTOSIS Exam: Rhytides and volume loss.  Treatment Plan: Continue Tretinoin  0.025% cream QHS. Patient would like to increase strength once she is able to tolerate the 0.025% cream every night.   Recommend daily broad spectrum sunscreen SPF 30+ to sun-exposed areas, reapply every 2 hours as needed. Call for new or changing lesions.  Staying in the shade  or wearing long sleeves, sun glasses (UVA+UVB protection) and wide brim hats (4-inch brim around the entire circumference of the hat) are also recommended for sun protection.   INTERTRIGO - axillary and inframammary Exam: Erythematous macerated patches in body folds  Chronic and persistent condition with duration or expected duration over one year. Condition is bothersome/symptomatic for patient. Currently flared.  Intertrigo is a chronic recurrent rash that occurs in skin fold areas that may be associated with friction; heat; moisture; yeast; fungus; and bacteria.  It is exacerbated by increased movement / activity; sweating; and higher atmospheric temperature.  Use of an absorbant powder such as Zeasorb AF powder or other OTC antifungal powder to the area daily can prevent rash recurrence. Other options to help keep the area dry include blow drying the  area after bathing or using antiperspirant products such as Duradry sweat minimizing gel.  Treatment Plan: Pt defers prescription tx at this time.  Return in about 1 year (around 09/10/2025) for Hx MM, BCC, dysplastic nevi, AK.  I, Rosina Mayans, CMA, am acting as scribe for Alm Rhyme, MD .   Documentation: I have reviewed the above documentation for accuracy and completeness, and I agree with the above.  Alm Rhyme, MD

## 2024-09-12 ENCOUNTER — Encounter: Payer: Self-pay | Admitting: Dermatology

## 2024-09-13 ENCOUNTER — Ambulatory Visit: Payer: PRIVATE HEALTH INSURANCE | Admitting: Dermatology

## 2025-09-16 ENCOUNTER — Ambulatory Visit: Payer: PRIVATE HEALTH INSURANCE | Admitting: Dermatology
